# Patient Record
Sex: Female | Born: 1935 | Race: White | Hispanic: No | Marital: Married | Smoking: Former smoker
Health system: Southern US, Community
[De-identification: ages and names within clinical notes are randomized; demographics above are authoritative.]

## PROBLEM LIST (undated history)

## (undated) DIAGNOSIS — I714 Abdominal aortic aneurysm, without rupture, unspecified: Secondary | ICD-10-CM

## (undated) DIAGNOSIS — K5790 Diverticulosis of intestine, part unspecified, without perforation or abscess without bleeding: Secondary | ICD-10-CM

## (undated) DIAGNOSIS — E785 Hyperlipidemia, unspecified: Secondary | ICD-10-CM

## (undated) DIAGNOSIS — K5792 Diverticulitis of intestine, part unspecified, without perforation or abscess without bleeding: Secondary | ICD-10-CM

## (undated) DIAGNOSIS — I471 Supraventricular tachycardia: Secondary | ICD-10-CM

## (undated) DIAGNOSIS — Z862 Personal history of diseases of the blood and blood-forming organs and certain disorders involving the immune mechanism: Secondary | ICD-10-CM

## (undated) DIAGNOSIS — E041 Nontoxic single thyroid nodule: Secondary | ICD-10-CM

## (undated) DIAGNOSIS — M069 Rheumatoid arthritis, unspecified: Secondary | ICD-10-CM

## (undated) DIAGNOSIS — Z87898 Personal history of other specified conditions: Secondary | ICD-10-CM

## (undated) DIAGNOSIS — K219 Gastro-esophageal reflux disease without esophagitis: Secondary | ICD-10-CM

## (undated) DIAGNOSIS — I639 Cerebral infarction, unspecified: Secondary | ICD-10-CM

## (undated) HISTORY — PX: KNEE SURGERY: SHX244

## (undated) HISTORY — PX: CATARACT EXTRACTION: SUR2

## (undated) HISTORY — DX: Personal history of diseases of the blood and blood-forming organs and certain disorders involving the immune mechanism: Z86.2

## (undated) HISTORY — PX: ABDOMINAL HYSTERECTOMY: SUR658

## (undated) HISTORY — DX: Personal history of other specified conditions: Z87.898

## (undated) HISTORY — DX: Diverticulosis of intestine, part unspecified, without perforation or abscess without bleeding: K57.90

## (undated) HISTORY — DX: Rheumatoid arthritis, unspecified: M06.9

## (undated) HISTORY — DX: Abdominal aortic aneurysm, without rupture: I71.4

## (undated) HISTORY — DX: Cerebral infarction, unspecified: I63.9

## (undated) HISTORY — DX: Gastro-esophageal reflux disease without esophagitis: K21.9

## (undated) HISTORY — DX: Hyperlipidemia, unspecified: E78.5

## (undated) HISTORY — DX: Diverticulitis of intestine, part unspecified, without perforation or abscess without bleeding: K57.92

## (undated) HISTORY — DX: Abdominal aortic aneurysm, without rupture, unspecified: I71.40

## (undated) HISTORY — PX: OTHER SURGICAL HISTORY: SHX169

## (undated) HISTORY — DX: Nontoxic single thyroid nodule: E04.1

## (undated) HISTORY — DX: Supraventricular tachycardia: I47.1

## (undated) HISTORY — PX: FOOT SURGERY: SHX648

---

## 2006-05-26 ENCOUNTER — Encounter: Payer: Self-pay | Admitting: Emergency Medicine

## 2006-05-26 ENCOUNTER — Inpatient Hospital Stay (HOSPITAL_COMMUNITY): Admission: EM | Admit: 2006-05-26 | Discharge: 2006-06-02 | Payer: Self-pay | Admitting: Internal Medicine

## 2006-06-26 ENCOUNTER — Encounter: Admission: RE | Admit: 2006-06-26 | Discharge: 2006-06-26 | Payer: Self-pay | Admitting: Rheumatology

## 2006-10-16 ENCOUNTER — Inpatient Hospital Stay (HOSPITAL_COMMUNITY): Admission: EM | Admit: 2006-10-16 | Discharge: 2006-10-19 | Payer: Self-pay | Admitting: Emergency Medicine

## 2007-06-10 ENCOUNTER — Encounter: Admission: RE | Admit: 2007-06-10 | Discharge: 2007-08-05 | Payer: Self-pay | Admitting: Family Medicine

## 2008-01-25 ENCOUNTER — Emergency Department (HOSPITAL_COMMUNITY): Admission: EM | Admit: 2008-01-25 | Discharge: 2008-01-26 | Payer: Self-pay | Admitting: Emergency Medicine

## 2008-04-06 ENCOUNTER — Emergency Department (HOSPITAL_COMMUNITY): Admission: EM | Admit: 2008-04-06 | Discharge: 2008-04-06 | Payer: Self-pay | Admitting: Emergency Medicine

## 2008-05-19 ENCOUNTER — Emergency Department (HOSPITAL_COMMUNITY): Admission: EM | Admit: 2008-05-19 | Discharge: 2008-05-19 | Payer: Self-pay | Admitting: Emergency Medicine

## 2009-01-23 ENCOUNTER — Emergency Department (HOSPITAL_COMMUNITY): Admission: EM | Admit: 2009-01-23 | Discharge: 2009-01-23 | Payer: Self-pay | Admitting: Emergency Medicine

## 2009-04-30 ENCOUNTER — Emergency Department (HOSPITAL_COMMUNITY): Admission: EM | Admit: 2009-04-30 | Discharge: 2009-04-30 | Payer: Self-pay | Admitting: Emergency Medicine

## 2009-06-02 ENCOUNTER — Encounter (HOSPITAL_COMMUNITY): Admission: RE | Admit: 2009-06-02 | Discharge: 2009-08-31 | Payer: Self-pay | Admitting: Rheumatology

## 2009-06-10 ENCOUNTER — Emergency Department (HOSPITAL_COMMUNITY): Admission: EM | Admit: 2009-06-10 | Discharge: 2009-06-11 | Payer: Self-pay | Admitting: Emergency Medicine

## 2009-09-14 ENCOUNTER — Encounter (HOSPITAL_COMMUNITY): Admission: RE | Admit: 2009-09-14 | Discharge: 2009-10-05 | Payer: Self-pay | Admitting: Rheumatology

## 2009-11-09 ENCOUNTER — Encounter (HOSPITAL_COMMUNITY)
Admission: RE | Admit: 2009-11-09 | Discharge: 2010-01-16 | Payer: Self-pay | Source: Home / Self Care | Attending: Rheumatology | Admitting: Rheumatology

## 2009-11-09 ENCOUNTER — Emergency Department (HOSPITAL_COMMUNITY): Admission: EM | Admit: 2009-11-09 | Discharge: 2009-11-09 | Payer: Self-pay | Admitting: Emergency Medicine

## 2009-12-01 ENCOUNTER — Inpatient Hospital Stay (HOSPITAL_COMMUNITY)
Admission: EM | Admit: 2009-12-01 | Discharge: 2009-12-05 | Payer: Self-pay | Source: Home / Self Care | Admitting: Emergency Medicine

## 2009-12-22 ENCOUNTER — Emergency Department (HOSPITAL_COMMUNITY)
Admission: EM | Admit: 2009-12-22 | Discharge: 2009-12-22 | Payer: Self-pay | Source: Home / Self Care | Admitting: Emergency Medicine

## 2010-01-07 ENCOUNTER — Inpatient Hospital Stay (HOSPITAL_COMMUNITY)
Admission: EM | Admit: 2010-01-07 | Discharge: 2010-01-12 | Payer: Self-pay | Source: Home / Self Care | Attending: Internal Medicine | Admitting: Internal Medicine

## 2010-02-19 ENCOUNTER — Other Ambulatory Visit: Payer: Self-pay | Admitting: Gastroenterology

## 2010-02-21 ENCOUNTER — Ambulatory Visit
Admission: RE | Admit: 2010-02-21 | Discharge: 2010-02-21 | Disposition: A | Discharge status: Home / Self Care | Payer: MEDICARE | Source: Ambulatory Visit | Attending: Gastroenterology | Admitting: Gastroenterology

## 2010-02-21 ENCOUNTER — Other Ambulatory Visit: Payer: Self-pay

## 2010-02-21 ENCOUNTER — Other Ambulatory Visit: Payer: Self-pay | Admitting: Gastroenterology

## 2010-02-21 ENCOUNTER — Ambulatory Visit: Payer: Self-pay

## 2010-02-22 ENCOUNTER — Other Ambulatory Visit: Payer: Self-pay

## 2010-03-06 NOTE — H&P (Signed)
[name], PELPHREY NO.:  [id number]  MEDICAL RECORD NO.:  [id number]           PATIENT TYPE:  LOCATION:                                 FACILITY:  PHYSICIAN:  Lucile Crater, MD         DATE OF BIRTH:  31-Oct-1922  DATE OF ADMISSION:  12/02/2009 DATE OF DISCHARGE:                             HISTORY & PHYSICAL   PRIMARY CARE PHYSICIAN:  Zenovia Jordan, MD  CHIEF COMPLAINT:  Generalized weakness and fall.  HISTORY OF PRESENT ILLNESS:  The patient is an 77 year old female with a history of hypertension, CVA in the past, rheumatoid arthritis.  The patient reportedly was having a virus which has affected her GI tract and she was having flu-like symptoms and she saw her primary care physician earlier this week and was told she had a viral infection.  The patient states that she has been feeling progressively weak day by day and she was trying to get out of the den towards her bedroom and she sustained a fall and she had multiple bruises with no major injuries. Reportedly, the patient's appetite has been poor and has not been taking much orally.  She reports to have had fever at home with a T-max of 102 and she has had nasal congestion with occasional epistaxis.  She states that this happens to her when she has her sinusitis act up.  REVIEW OF SYSTEMS:  A complete review of systems was done, which include general; head, eyes, ears, nose, and throat; cardiovascular; respiratory; GI; GU; endocrine; musculoskeletal; skin; neurologic; and psychiatric all were within normal limits other than what was mentioned in the history of present illness.  PAST MEDICAL HISTORY: 1. Hypertension. 2. CVA. 3. Myocardial infarction. 4. Rheumatoid arthritis. 5. Chronic sinusitis.  ALLERGIES:  Sulfa.  CURRENT MEDICATIONS: 1. Amoxicillin 500 mg before dental procedures. 2. Metoprolol 100 mg b.i.d. 3. Crestor 10 mg once a day. 4. Hydroxychloroquine 200 mg once a day. 5.  Meclizine 25 mg 4 times a day p.r.n. 6. Methotrexate 2.5 mg 7 tablets every week. 7. Prednisone 5 mg specialized dosing. 8. Tramadol 1-2 tablets as needed for pain. 9. Potassium chloride 10 mEq once a day. 10.Triamterene/hydrochlorothiazide 37.5/25 mg as needed. 11.Temazepam 15 mg q.h.s. 12.Plavix 75 mg once a day.  SOCIAL HISTORY:  There is no history of tobacco, alcohol, or illicit drugs.  She lives at home with her husband.  FAMILY HISTORY:  Noncontributory.  PHYSICAL EXAMINATION:  VITAL SIGNS:  T-max 102, pulse rate of 118, respiratory rate of 20, O2 sat 97% on room air, blood pressure 170/90. GENERAL APPEARANCE:  Not in acute distress, alert and oriented x3. Afebrile. HEENT:  Normocephalic, atraumatic.  The pupils are equal and reactive to light and accommodation.  Extraocular muscles are intact.  Mucous membranes are moist. NECK:  Supple.  No JVD, lymphadenopathy, or carotid bruit. CVS:  Regular rhythm, rate is normal.  No murmurs, rubs, or gallops. LUNGS:  Clear to auscultation bilaterally. ABDOMEN:  Benign. EXTREMITIES:  No clubbing, cyanosis, or edema. NEUROLOGIC:  Grossly nonfocal.  LABORATORY DATA AND STUDIES: 1. Sodium 139,  potassium 3.1, chloride 102, bicarb of 26, BUN 9,     creatinine 0.84, blood glucose 87, calcium 8.3. 2. Urinalysis within normal limits. 3. WBC 6700, hemoglobin 11.3, hematocrit 100.8, platelets 127,000.     Neutrophils 73%. 4. Chest x-ray, clear.  ASSESSMENT AND PLAN: 1. Generalized weakness with multiple falls.  The patient does not     describe this as orthostasis.  She clearly is dehydrated with less     oral intake, hypovolemia is a possibility.  We will get a CT scan     of the head to evaluate if she had any acute injury from the fall,     and we will not continue any of her diuretics at this time.  We     will closely monitor her. 2. Hypertension, very well controlled.  We will continue her home     medications. 3. Atrial  fibrillation, currently tachycardic, but in sinus rhythm. 4. Acute-on-chronic sinusitis.  We will start her on Augmentin, and we     will also supplement with cetirizine. 5. Coronary artery disease.  We will continue to modify her risk     factors. 6. Rheumatoid arthritis.  We will continue her home medications.  CODE STATUS:  The patient wants to be a full code.  We will honor her wishes.  The patient will be admitted to the hospital for further evaluation and management.     Lucile Crater, MD     TA/MEDQ  D:  12/02/2009  T:  12/02/2009  Job:  [id number]  Electronically Signed by Lucile Crater MD on 03/06/2010 04:15:42 PM

## 2010-03-13 ENCOUNTER — Emergency Department (HOSPITAL_COMMUNITY): Admission: EM | Admit: 2010-03-13 | Payer: MEDICARE | Source: Home / Self Care

## 2010-03-13 ENCOUNTER — Emergency Department (HOSPITAL_COMMUNITY): Payer: Medicare Other

## 2010-03-13 ENCOUNTER — Inpatient Hospital Stay (HOSPITAL_COMMUNITY)
Admission: RE | Admit: 2010-03-13 | Discharge: 2010-03-22 | DRG: 392 | Disposition: A | Discharge status: Home Health | Payer: Medicare Other | Source: Ambulatory Visit | Attending: Internal Medicine | Admitting: Internal Medicine

## 2010-03-13 ENCOUNTER — Ambulatory Visit (HOSPITAL_COMMUNITY): Payer: Medicare Other

## 2010-03-13 DIAGNOSIS — I2510 Atherosclerotic heart disease of native coronary artery without angina pectoris: Secondary | ICD-10-CM | POA: Diagnosis present

## 2010-03-13 DIAGNOSIS — I4891 Unspecified atrial fibrillation: Secondary | ICD-10-CM | POA: Diagnosis present

## 2010-03-13 DIAGNOSIS — R1310 Dysphagia, unspecified: Secondary | ICD-10-CM | POA: Diagnosis present

## 2010-03-13 DIAGNOSIS — K228 Other specified diseases of esophagus: Secondary | ICD-10-CM | POA: Diagnosis present

## 2010-03-13 DIAGNOSIS — K224 Dyskinesia of esophagus: Secondary | ICD-10-CM | POA: Diagnosis present

## 2010-03-13 DIAGNOSIS — K2289 Other specified disease of esophagus: Secondary | ICD-10-CM | POA: Diagnosis present

## 2010-03-13 DIAGNOSIS — D638 Anemia in other chronic diseases classified elsewhere: Secondary | ICD-10-CM | POA: Diagnosis present

## 2010-03-13 DIAGNOSIS — I252 Old myocardial infarction: Secondary | ICD-10-CM

## 2010-03-13 DIAGNOSIS — M545 Low back pain, unspecified: Secondary | ICD-10-CM | POA: Diagnosis present

## 2010-03-13 DIAGNOSIS — E46 Unspecified protein-calorie malnutrition: Secondary | ICD-10-CM | POA: Diagnosis present

## 2010-03-13 DIAGNOSIS — M069 Rheumatoid arthritis, unspecified: Secondary | ICD-10-CM | POA: Diagnosis present

## 2010-03-13 DIAGNOSIS — D509 Iron deficiency anemia, unspecified: Secondary | ICD-10-CM | POA: Diagnosis present

## 2010-03-13 DIAGNOSIS — K222 Esophageal obstruction: Secondary | ICD-10-CM | POA: Diagnosis present

## 2010-03-13 DIAGNOSIS — E785 Hyperlipidemia, unspecified: Secondary | ICD-10-CM | POA: Diagnosis present

## 2010-03-13 DIAGNOSIS — E041 Nontoxic single thyroid nodule: Secondary | ICD-10-CM | POA: Diagnosis present

## 2010-03-13 DIAGNOSIS — R42 Dizziness and giddiness: Secondary | ICD-10-CM | POA: Diagnosis present

## 2010-03-13 DIAGNOSIS — K5732 Diverticulitis of large intestine without perforation or abscess without bleeding: Principal | ICD-10-CM | POA: Diagnosis present

## 2010-03-13 DIAGNOSIS — E876 Hypokalemia: Secondary | ICD-10-CM | POA: Diagnosis present

## 2010-03-13 DIAGNOSIS — N179 Acute kidney failure, unspecified: Secondary | ICD-10-CM | POA: Diagnosis present

## 2010-03-13 LAB — HEPATIC FUNCTION PANEL
AST: 32 U/L (ref 0–37)
Alkaline Phosphatase: 43 U/L (ref 39–117)
Bilirubin, Direct: 0.2 mg/dL (ref 0.0–0.3)
Total Bilirubin: 0.9 mg/dL (ref 0.3–1.2)

## 2010-03-13 LAB — BASIC METABOLIC PANEL
CO2: 31 mEq/L (ref 19–32)
Calcium: 8.6 mg/dL (ref 8.4–10.5)
Chloride: 98 mEq/L (ref 96–112)
Creatinine, Ser: 1.87 mg/dL — ABNORMAL HIGH (ref 0.4–1.2)
Glucose, Bld: 78 mg/dL (ref 70–99)

## 2010-03-13 LAB — DIFFERENTIAL
Lymphocytes Relative: 13 % (ref 12–46)
Lymphs Abs: 1.2 10*3/uL (ref 0.7–4.0)
Monocytes Absolute: 0.1 10*3/uL (ref 0.1–1.0)
Monocytes Relative: 1 % — ABNORMAL LOW (ref 3–12)
Neutro Abs: 7.9 10*3/uL — ABNORMAL HIGH (ref 1.7–7.7)

## 2010-03-13 LAB — CBC
HCT: 33.8 % — ABNORMAL LOW (ref 36.0–46.0)
Hemoglobin: 10.3 g/dL — ABNORMAL LOW (ref 12.0–15.0)
MCH: 29.6 pg (ref 26.0–34.0)
MCHC: 30.5 g/dL (ref 30.0–36.0)
MCV: 97.1 fL (ref 78.0–100.0)
RBC: 3.48 MIL/uL — ABNORMAL LOW (ref 3.87–5.11)

## 2010-03-13 LAB — LIPASE, BLOOD: Lipase: 54 U/L (ref 11–59)

## 2010-03-14 ENCOUNTER — Inpatient Hospital Stay (HOSPITAL_COMMUNITY): Payer: Medicare Other

## 2010-03-14 LAB — FOLATE: Folate: >20 ng/mL

## 2010-03-14 LAB — MAGNESIUM: Magnesium: 1.3 mg/dL — ABNORMAL LOW (ref 1.5–2.5)

## 2010-03-14 LAB — VITAMIN B12: Vitamin B-12: 245 pg/mL (ref 211–911)

## 2010-03-14 LAB — COMPREHENSIVE METABOLIC PANEL
Albumin: 2.5 g/dL — ABNORMAL LOW (ref 3.5–5.2)
Alkaline Phosphatase: 33 U/L — ABNORMAL LOW (ref 39–117)
BUN: 17 mg/dL (ref 6–23)
Chloride: 104 mEq/L (ref 96–112)
Creatinine, Ser: 1.46 mg/dL — ABNORMAL HIGH (ref 0.4–1.2)
GFR calc non Af Amer: 34 mL/min — ABNORMAL LOW (ref >60)
Glucose, Bld: 109 mg/dL — ABNORMAL HIGH (ref 70–99)
Potassium: 3.1 mEq/L — ABNORMAL LOW (ref 3.5–5.1)
Total Bilirubin: 0.6 mg/dL (ref 0.3–1.2)

## 2010-03-14 LAB — GLUCOSE, CAPILLARY: Glucose-Capillary: 79 mg/dL (ref 70–99)

## 2010-03-14 LAB — URINALYSIS, MICROSCOPIC ONLY
Bilirubin Urine: NEGATIVE
Hgb urine dipstick: NEGATIVE
Specific Gravity, Urine: 1.009 (ref 1.005–1.030)
Urine Glucose, Fasting: NEGATIVE mg/dL
Urobilinogen, UA: 1 mg/dL (ref 0.0–1.0)
pH: 7.5 (ref 5.0–8.0)

## 2010-03-14 LAB — CBC
HCT: 29.5 % — ABNORMAL LOW (ref 36.0–46.0)
MCH: 30.2 pg (ref 26.0–34.0)
MCV: 98 fL (ref 78.0–100.0)
Platelets: 146 10*3/uL — ABNORMAL LOW (ref 150–400)
RBC: 3.01 MIL/uL — ABNORMAL LOW (ref 3.87–5.11)
WBC: 6.7 10*3/uL (ref 4.0–10.5)

## 2010-03-14 LAB — IRON AND TIBC
Iron: 40 ug/dL — ABNORMAL LOW (ref 42–135)
UIBC: 145 ug/dL

## 2010-03-14 LAB — MRSA PCR SCREENING: MRSA by PCR: POSITIVE — AB

## 2010-03-14 LAB — FERRITIN: Ferritin: 323 ng/mL — ABNORMAL HIGH (ref 10–291)

## 2010-03-14 LAB — TSH: TSH: 2.925 u[IU]/mL (ref 0.350–4.500)

## 2010-03-15 ENCOUNTER — Inpatient Hospital Stay (HOSPITAL_COMMUNITY): Payer: Medicare Other

## 2010-03-15 LAB — DIFFERENTIAL
Basophils Absolute: 0 10*3/uL (ref 0.0–0.1)
Basophils Relative: 0 % (ref 0–1)
Eosinophils Relative: 1 % (ref 0–5)
Monocytes Absolute: 0.1 10*3/uL (ref 0.1–1.0)
Monocytes Relative: 3 % (ref 3–12)
Neutro Abs: 2.9 10*3/uL (ref 1.7–7.7)

## 2010-03-15 LAB — CBC
Hemoglobin: 9.1 g/dL — ABNORMAL LOW (ref 12.0–15.0)
MCH: 29.8 pg (ref 26.0–34.0)
MCHC: 30.3 g/dL (ref 30.0–36.0)
RDW: 16.7 % — ABNORMAL HIGH (ref 11.5–15.5)

## 2010-03-15 LAB — COMPREHENSIVE METABOLIC PANEL
AST: 31 U/L (ref 0–37)
CO2: 27 mEq/L (ref 19–32)
Calcium: 8.1 mg/dL — ABNORMAL LOW (ref 8.4–10.5)
Creatinine, Ser: 1.36 mg/dL — ABNORMAL HIGH (ref 0.4–1.2)
GFR calc Af Amer: 45 mL/min — ABNORMAL LOW (ref >60)
GFR calc non Af Amer: 37 mL/min — ABNORMAL LOW (ref >60)
Glucose, Bld: 94 mg/dL (ref 70–99)
Sodium: 139 mEq/L (ref 135–145)
Total Protein: 4.8 g/dL — ABNORMAL LOW (ref 6.0–8.3)

## 2010-03-15 LAB — GLUCOSE, CAPILLARY: Glucose-Capillary: 139 mg/dL — ABNORMAL HIGH (ref 70–99)

## 2010-03-15 LAB — MAGNESIUM: Magnesium: 4.4 mg/dL — ABNORMAL HIGH (ref 1.5–2.5)

## 2010-03-15 NOTE — Op Note (Signed)
NAMEAILSA, MIRELES NO.:  [id number]  MEDICAL RECORD NO.:  [id number]           PATIENT TYPE:  O  LOCATION:  WLEN                         FACILITY:  [facility]  PHYSICIAN:  Bernette Redbird, M.D.   DATE OF BIRTH:  May 26, 1947  DATE OF PROCEDURE:  03/13/2010 DATE OF DISCHARGE:                              OPERATIVE REPORT   PROCEDURE:  Upper endoscopy with balloon and Savary dilatation of the esophagus.  INDICATIONS:  An 77 year old patient with rheumatoid arthritis, on Plavix, and known severe esophageal dysmotility who nonetheless recently had hang-up of a barium tablet documented in the proximal esophagus, unable to eat, food coming back up, intolerant of Ensure as her chronic diet, and losing 10 pounds a month for a total weight loss so far of about 25 pounds over the past several months.  Dilatation is being performed to try to alleviate the dysphagia symptoms.  FINDINGS:  Discrete distal esophageal ring, questionable narrowing inthe cervical esophagus.  A 12 mm balloon dilatation performed proximally and distally, followed by Savary dilatation to 14 and then 15 mm.  PROCEDURE:  The nature, purpose, risks of the procedure had been carefully discussed with the patient and her family member.  She came as an outpatient to the [facility] and she provided written consent.  It was elected to keep the patient on her Plavix.  Sedation was propofol per anesthesia.  The Pentax video endoscope was passed under direct vision.  The larynx and vocal cords looked normal.  The esophagus was actually easily entered.  There was no resistance to passage of the endoscope through the cervical esophageal region and down the esophagus which had a lot of spastic motility to the stomach.  With appropriate insufflation of the esophagus, it was possible to confirm the presence of a rather widely patent esophageal mucosal ring at the squamocolumnar junction consistent  with a Schatzki ring.  There was no evidence of any neoplasia, infection, or varices in the esophagus.  The stomach contained no significant residual and had normal mucosa including a retroflex view of the cardia, and the pylorus, duodenal bulb, and second duodenum were unremarkable.  Based on the patient's dysphagia symptoms and the radiographic finding of hang-up of a barium tablet in the proximal esophagus, I went ahead and performed balloon dilatation to 12 mm for approximately 1 minute, both in the distal esophagus and in the proximal esophagus.  This led to minimal mucosal abrasion but no obvious fracture of a ring or stricture. Based on the severity of the patient's symptoms, I felt it was important to go ahead and try to confirm definitively that an adequate luminal diameter had been established, so we went on to Savary dilatation.  The spring tip guidewire was passed into the antrum of the stomach, fluoroscopic confirmation of appropriate positioning of the wire was achieved, and sequential passage was made with Savary dilators, sizes 14 and 15 mm, encountering smooth resistance consistent with esophageal spasm.  The widest portion of the dilator in each case was confirmed to reach level of the diaphragm or below, but I did endoscope the  patient in between the tube dilatations to make sure there was no significant mucosal disruption.  Fluoroscopic confirmation of appropriate wire positioning was used on the second dilatation as well as the first. When completed, after withdrawal of the larger dilator, the patient was re-endoscoped under direct vision.  This showed a small amount of fresh hemorrhage but no active bleeding and no serious lacerations, no evidence of perforation, no active bleeding, and no obvious fracture of her ring or overt mucosal disruption.  The patient tolerated the procedure well, and there were no apparent complications.  IMPRESSION: 1. Esophageal  ring, not corresponding in location of the hang-up of     the barium tablet seen on her recent barium swallow. 2. Endoscopic correlation of the esophageal dysmotility based on     spastic appearance of the esophagus as noted radiographically. 3. Esophageal dilatation performed as described above to 12 mm     proximally and distally using balloons through the scope, and then     14 and 15 mm Savary dilators.  PLAN:  Clinical followup of dysphagia symptoms.          ______________________________ Bernette Redbird, M.D.     RB/MEDQ  D:  03/13/2010  T:  03/13/2010  Job:  [id number]  cc:   Fanny Dance. Rankins, M.D. Fax: [phone]  Electronically Signed by Bernette Redbird M.D. on 03/15/2010 07:59:25 AM

## 2010-03-16 LAB — URINE CULTURE
Colony Count: 15000
Special Requests: NEGATIVE

## 2010-03-16 LAB — CBC
HCT: 27.1 % — ABNORMAL LOW (ref 36.0–46.0)
MCH: 30.6 pg (ref 26.0–34.0)
MCHC: 31.7 g/dL (ref 30.0–36.0)
MCV: 96.4 fL (ref 78.0–100.0)
RDW: 16.7 % — ABNORMAL HIGH (ref 11.5–15.5)

## 2010-03-16 LAB — BASIC METABOLIC PANEL
BUN: 9 mg/dL (ref 6–23)
Calcium: 8 mg/dL — ABNORMAL LOW (ref 8.4–10.5)
Creatinine, Ser: 1.31 mg/dL — ABNORMAL HIGH (ref 0.4–1.2)
GFR calc non Af Amer: 39 mL/min — ABNORMAL LOW (ref >60)
Glucose, Bld: 100 mg/dL — ABNORMAL HIGH (ref 70–99)

## 2010-03-16 LAB — GLUCOSE, CAPILLARY
Glucose-Capillary: 126 mg/dL — ABNORMAL HIGH (ref 70–99)
Glucose-Capillary: 97 mg/dL (ref 70–99)

## 2010-03-17 LAB — GLUCOSE, CAPILLARY: Glucose-Capillary: 78 mg/dL (ref 70–99)

## 2010-03-17 LAB — BASIC METABOLIC PANEL
CO2: 28 mEq/L (ref 19–32)
Calcium: 8.1 mg/dL — ABNORMAL LOW (ref 8.4–10.5)
Creatinine, Ser: 1.05 mg/dL (ref 0.4–1.2)
GFR calc Af Amer: >60 mL/min (ref >60)
Glucose, Bld: 98 mg/dL (ref 70–99)

## 2010-03-17 LAB — CBC
Hemoglobin: 8.9 g/dL — ABNORMAL LOW (ref 12.0–15.0)
MCH: 30 pg (ref 26.0–34.0)
MCHC: 31 g/dL (ref 30.0–36.0)

## 2010-03-18 LAB — GLUCOSE, CAPILLARY
Glucose-Capillary: 107 mg/dL — ABNORMAL HIGH (ref 70–99)
Glucose-Capillary: 86 mg/dL (ref 70–99)

## 2010-03-18 LAB — BASIC METABOLIC PANEL
CO2: 24 mEq/L (ref 19–32)
Chloride: 108 mEq/L (ref 96–112)
GFR calc Af Amer: 59 mL/min — ABNORMAL LOW (ref >60)
Sodium: 139 mEq/L (ref 135–145)

## 2010-03-19 LAB — CBC
MCH: 30.5 pg (ref 26.0–34.0)
MCHC: 31.3 g/dL (ref 30.0–36.0)
Platelets: 81 10*3/uL — ABNORMAL LOW (ref 150–400)

## 2010-03-19 LAB — GLUCOSE, CAPILLARY
Glucose-Capillary: 70 mg/dL (ref 70–99)
Glucose-Capillary: 135 mg/dL — ABNORMAL HIGH (ref 70–99)
Glucose-Capillary: 116 mg/dL — ABNORMAL HIGH (ref 70–99)

## 2010-03-19 LAB — BASIC METABOLIC PANEL
BUN: 6 mg/dL (ref 6–23)
Chloride: 107 mEq/L (ref 96–112)
GFR calc Af Amer: >60 mL/min (ref >60)
GFR calc non Af Amer: 57 mL/min — ABNORMAL LOW (ref >60)
Glucose, Bld: 84 mg/dL (ref 70–99)
Potassium: 3.1 mEq/L — ABNORMAL LOW (ref 3.5–5.1)

## 2010-03-20 ENCOUNTER — Inpatient Hospital Stay (HOSPITAL_COMMUNITY): Payer: Medicare Other

## 2010-03-20 LAB — GLUCOSE, CAPILLARY
Glucose-Capillary: 111 mg/dL — ABNORMAL HIGH (ref 70–99)
Glucose-Capillary: 114 mg/dL — ABNORMAL HIGH (ref 70–99)

## 2010-03-20 LAB — BASIC METABOLIC PANEL
CO2: 25 mEq/L (ref 19–32)
Chloride: 111 mEq/L (ref 96–112)
GFR calc Af Amer: >60 mL/min (ref >60)
Potassium: 3.7 mEq/L (ref 3.5–5.1)

## 2010-03-20 LAB — URINALYSIS, DIPSTICK ONLY
Nitrite: NEGATIVE
Specific Gravity, Urine: 1.013 (ref 1.005–1.030)
Urobilinogen, UA: 0.2 mg/dL (ref 0.0–1.0)
pH: 6 (ref 5.0–8.0)

## 2010-03-20 LAB — CBC
HCT: 26.9 % — ABNORMAL LOW (ref 36.0–46.0)
MCH: 30.5 pg (ref 26.0–34.0)
MCV: 97.8 fL (ref 78.0–100.0)
RBC: 2.75 MIL/uL — ABNORMAL LOW (ref 3.87–5.11)
WBC: 4.2 10*3/uL (ref 4.0–10.5)

## 2010-03-21 LAB — GLUCOSE, CAPILLARY
Glucose-Capillary: 128 mg/dL — ABNORMAL HIGH (ref 70–99)
Glucose-Capillary: 88 mg/dL (ref 70–99)
Glucose-Capillary: 97 mg/dL (ref 70–99)

## 2010-03-22 LAB — URINE CULTURE
Colony Count: NO GROWTH
Culture  Setup Time: 201203070128

## 2010-03-22 LAB — GLUCOSE, CAPILLARY: Glucose-Capillary: 93 mg/dL (ref 70–99)

## 2010-03-22 NOTE — Op Note (Signed)
  NAMEJALIE, [name]                 ACCOUNT NO.:  [id number]  MEDICAL RECORD NO.:  [id number]           PATIENT TYPE:  I  LOCATION:  1513                         FACILITY:  [facility]  PHYSICIAN:  Zerick Prevette C. Madilyn Fireman, M.D.    DATE OF BIRTH:  [birth year]  DATE OF PROCEDURE:  03/16/2010 DATE OF DISCHARGE:                              OPERATIVE REPORT   INDICATIONS FOR PROCEDURE:  Persistent dysphagia with failure to respond to dilatation and several barium swallows showing significant dysmotility.  PROCEDURE:  The patient was placed in the left lateral decubitus position and placed on the pulse monitor with continuous low-flow oxygen delivered by nasal cannula.  She was sedated with 30 mcg IV fentanyl and 3 mg IV Versed.  Olympus video endoscope was advanced under direct vision into the oropharynx and esophagus.  The esophagus was straight with numerous simultaneous contractions occurring throughout the period of observation.  The lower esophageal sphincter was not seen to relax during the time it was observed.  The scope was advanced into the stomach where a small amount of liquid secretions were suctioned from the fundus.  Retroflexed view of the cardia was unremarkable.  The fundus, body, antrum and pylorus all appeared normal.  The duodenum was entered and both the bulb and second portion were well inspected and appeared to be within normal limits.  The scope was advanced back into the distal esophagus.  Botulinum toxin was injected in 4 aliquots in 4 different quadrants at the GE junction 25 units each.  After this, the lower esophageal sphincter relaxed and rather clearly demonstrated a distal typical fibrotic-appearing lower esophageal ring.  Also about a centimeter above it, there was at least 50% circumferential less narrow ring.  These were photographed.  The scope was then withdrawn, and the patient returned to the recovery room in stable condition.  She tolerated the procedure well.   There were no immediate complications.  IMPRESSION:  Evidence of esophageal dysmotility but with 1 and possibly 2 lower esophageal rings visualized only after Botox injection.  PLAN:  We will observe response to Botox injection, but if no good response, we will probably repeat EGD with Savary dilation to at least 16 mm.          ______________________________ Everardo All. Madilyn Fireman, M.D.     JCH/MEDQ  D:  03/16/2010  T:  03/16/2010  Job:  [id number]  Electronically Signed by Dorena Cookey M.D. on 03/20/2010 07:14:00 PM

## 2010-03-23 NOTE — Discharge Summary (Signed)
[name], [name] NO.:  [id number]  MEDICAL RECORD NO.:  [id number]           PATIENT TYPE:  I  LOCATION:  1513                         FACILITY:  [facility]  PHYSICIAN:  Talmage Nap, MD  DATE OF BIRTH:  01/21/1922  DATE OF ADMISSION:  03/13/2010 DATE OF DISCHARGE:  03/22/2010                        DISCHARGE SUMMARY - REFERRING   PRIMARY CARE PHYSICIAN:  [name] Fiedler, MD  GASTROENTEROLOGIST:  Bernette Redbird, MD  DISCHARGE DIAGNOSES: 1. Acute sigmoid diverticulitis. 2. Dysphagia/esophageal stricture/esophageal dysmotility, status post     esophagogastroduodenoscopy with Botox. 3. Electrolyte imbalance, i.e., hypokalemia, repleted. 4. Cystic thyroid mass. 5. Acute renal failure, resolved. 6. Dimorphic anemia (chronic disorder and iron deficiency). 7. Protein energy malnutrition. 8. Rheumatoid arthritis. 9. Hypertension. 10.Atrial fibrillation.  The patient is not a candidate for active     coagulation. 11.Coronary artery disease. 12.Chronic low back pain. 13.Hyperlipidemia. 14.Chronic vertigo.  HOSPITAL COURSE:  Please for hospital course on this patient from admission to March 18, 2010, refer to the progress note dictated by Dr. Ramiro Harvest.  The patient was also seen from March 5 to 6 by Dr. Ebony Cargo.  The patient was however seen by me on two occasions.  The very first occasion was on March 21, 2010, and in this encounter, the patient complained about episode of loose stools and examination of the patient was essentially unremarkable.  Her vital signs, blood pressure was 106/57, pulse 87, respirations 14, temperature 98.2.  In this encounter, no major changes were made in the patient's management, however, the patient was given Lomotil 2 tablets p.o. stat.  She was however reevaluated by me on March 22, 2010.  The frequency of diarrhea had reduced remarkably and again examination of the patient was essentially unremarkable.  Her  vital signs, blood pressure 112/63, pulse 77, respiratory rate 20, temperature 98.0.  The patient is however medically stable.  Plan is for the patient to be discharged today.  DISCHARGE INSTRUCTIONS: 1. Activity:  As tolerated. 2. Diet:  Low-sodium, low-cholesterol diet, as well as cardiac prudent     diet. 3. She will be followed up by her primary care doctor in 1 to 2 weeks. 4. She will also require home health, PT, OT and RN.  MEDICATIONS ON DISCHARGE: 1. Ciprofloxacin 500 mg p.o. b.i.d. for the next 5 days. 2. Lomotil 2 tablets p.o. with each loose stool, not to exceed 8     tablets in 24 hours. 3. Metronidazole (Flagyl) 250 mg 1 p.o. b.i.d. for 5 days. 4. Align (Bifidobacterium infantis) 4 mg 1 capsule p.o. daily. 5. Calcium carbonate 600 mg/vitamin D 400 units 2 tablets p.o. daily. 6. Crestor (rosuvastatin) 10 mg 1 p.o. daily at bedtime. 7. Folic acid 2.4 mg 3 tablets p.o. daily. 8. Hydroxychloroquine (Plaquenil) 20 mg 2 tablets p.o. daily. 9. Imodium (loperamide) 2 mg 1 p.o. q.4 p.r.n. 10.Maalox (magnesium hydroxide/aluminium hydroxide) 1 tablespoonful by     mouth twice a day p.r.n. 11.Meclizine 25 mg 1 p.o. 4 times p.r.n. 12.Methotrexate 2.5 mg, that is 6 tablets p.o. every week. 13.Metoprolol tartrate 100 mg 1 p.o. b.i.d. 14.MiraLAX (polyethylene glycol) 3350  8.5 g by mouth daily. 15.Multivitamin 1 tablet p.o. daily. 16.Ondansetron 4 mg 1 p.o. q.4 h p.r.n. 17.Phenylephrine 10 mg 1 p.o. daily p.r.n. 18.Plavix (clopidogrel) 75 mg p.o. daily. 19.Potassium chloride 10 mEq p.o. daily. 20.Prednisone 5 mg 1-1/2 tablet p.o. daily. 21.Protonix (pantoprazole) 40 mg 1 p.o. daily. 22.Simethicone (Gas-X) over-the-counter 1 p.o. t.i.d. p.r.n. 23.Tramadol 50 mg 1 to 2 tablets p.o. t.i.d. p.r.n. 24.Triamterene/hydrochlorothiazide 7.5/25 1 p.o. daily. 25.Tylenol Extra Strength (acetaminophen) 500 mg 2 tablets p.o. q.6     p.r.n.     Talmage Nap, MD     CN/MEDQ  D:   03/22/2010  T:  03/22/2010  Job:  [id number]  cc:   Fanny Dance. Rankins, M.D. Fax: [phone]  Electronically Signed by Talmage Nap  on 03/22/2010 06:07:23 PM

## 2010-03-26 ENCOUNTER — Inpatient Hospital Stay (HOSPITAL_COMMUNITY)
Admission: AD | Admit: 2010-03-26 | Discharge: 2010-04-06 | DRG: 194 | Disposition: A | Discharge status: Skilled Nursing Facility | Payer: Medicare Other | Source: Ambulatory Visit | Attending: Internal Medicine | Admitting: Internal Medicine

## 2010-03-26 ENCOUNTER — Inpatient Hospital Stay (HOSPITAL_COMMUNITY): Payer: Medicare Other

## 2010-03-26 DIAGNOSIS — R1310 Dysphagia, unspecified: Secondary | ICD-10-CM | POA: Diagnosis present

## 2010-03-26 DIAGNOSIS — E785 Hyperlipidemia, unspecified: Secondary | ICD-10-CM | POA: Diagnosis present

## 2010-03-26 DIAGNOSIS — E041 Nontoxic single thyroid nodule: Secondary | ICD-10-CM | POA: Diagnosis present

## 2010-03-26 DIAGNOSIS — M069 Rheumatoid arthritis, unspecified: Secondary | ICD-10-CM | POA: Diagnosis present

## 2010-03-26 DIAGNOSIS — D702 Other drug-induced agranulocytosis: Secondary | ICD-10-CM | POA: Diagnosis present

## 2010-03-26 DIAGNOSIS — I2510 Atherosclerotic heart disease of native coronary artery without angina pectoris: Secondary | ICD-10-CM | POA: Diagnosis present

## 2010-03-26 DIAGNOSIS — E876 Hypokalemia: Secondary | ICD-10-CM | POA: Diagnosis not present

## 2010-03-26 DIAGNOSIS — J189 Pneumonia, unspecified organism: Principal | ICD-10-CM | POA: Diagnosis present

## 2010-03-26 DIAGNOSIS — R627 Adult failure to thrive: Secondary | ICD-10-CM | POA: Diagnosis present

## 2010-03-26 DIAGNOSIS — I10 Essential (primary) hypertension: Secondary | ICD-10-CM | POA: Diagnosis present

## 2010-03-26 DIAGNOSIS — IMO0002 Reserved for concepts with insufficient information to code with codable children: Secondary | ICD-10-CM

## 2010-03-26 DIAGNOSIS — K5732 Diverticulitis of large intestine without perforation or abscess without bleeding: Secondary | ICD-10-CM | POA: Diagnosis present

## 2010-03-26 DIAGNOSIS — T3795XA Adverse effect of unspecified systemic anti-infective and antiparasitic, initial encounter: Secondary | ICD-10-CM | POA: Diagnosis present

## 2010-03-26 DIAGNOSIS — D638 Anemia in other chronic diseases classified elsewhere: Secondary | ICD-10-CM | POA: Diagnosis present

## 2010-03-26 DIAGNOSIS — I4891 Unspecified atrial fibrillation: Secondary | ICD-10-CM | POA: Diagnosis present

## 2010-03-26 DIAGNOSIS — K224 Dyskinesia of esophagus: Secondary | ICD-10-CM | POA: Diagnosis present

## 2010-03-26 DIAGNOSIS — T373X5A Adverse effect of other antiprotozoal drugs, initial encounter: Secondary | ICD-10-CM | POA: Diagnosis present

## 2010-03-26 DIAGNOSIS — E46 Unspecified protein-calorie malnutrition: Secondary | ICD-10-CM | POA: Diagnosis present

## 2010-03-26 DIAGNOSIS — G894 Chronic pain syndrome: Secondary | ICD-10-CM | POA: Diagnosis present

## 2010-03-26 LAB — COMPREHENSIVE METABOLIC PANEL
ALT: 8 U/L (ref 0–35)
ALT: 19 U/L (ref 0–35)
AST: 36 U/L (ref 0–37)
Albumin: 2.7 g/dL — ABNORMAL LOW (ref 3.5–5.2)
Alkaline Phosphatase: 53 U/L (ref 39–117)
BUN: 15 mg/dL (ref 6–23)
BUN: 4 mg/dL — ABNORMAL LOW (ref 6–23)
CO2: 27 mEq/L (ref 19–32)
CO2: 27 mEq/L (ref 19–32)
CO2: 22 mEq/L (ref 19–32)
Calcium: 7.4 mg/dL — ABNORMAL LOW (ref 8.4–10.5)
Calcium: 8.2 mg/dL — ABNORMAL LOW (ref 8.4–10.5)
Chloride: 98 mEq/L (ref 96–112)
Chloride: 102 mEq/L (ref 96–112)
Creatinine, Ser: 0.9 mg/dL (ref 0.4–1.2)
Creatinine, Ser: 1.26 mg/dL — ABNORMAL HIGH (ref 0.4–1.2)
Creatinine, Ser: 1.06 mg/dL (ref 0.4–1.2)
GFR calc non Af Amer: 40 mL/min — ABNORMAL LOW (ref >60)
GFR calc non Af Amer: 60 mL/min — ABNORMAL LOW (ref >60)
GFR calc non Af Amer: 49 mL/min — ABNORMAL LOW (ref >60)
Glucose, Bld: 86 mg/dL (ref 70–99)
Glucose, Bld: 57 mg/dL — ABNORMAL LOW (ref 70–99)
Potassium: 3.2 mEq/L — ABNORMAL LOW (ref 3.5–5.1)
Sodium: 137 mEq/L (ref 135–145)
Total Bilirubin: 0.7 mg/dL (ref 0.3–1.2)
Total Bilirubin: 0.9 mg/dL (ref 0.3–1.2)

## 2010-03-26 LAB — CBC
HCT: 31.9 % — ABNORMAL LOW (ref 36.0–46.0)
HCT: 35.3 % — ABNORMAL LOW (ref 36.0–46.0)
HCT: 38.9 % (ref 36.0–46.0)
Hemoglobin: 10.7 g/dL — ABNORMAL LOW (ref 12.0–15.0)
Hemoglobin: 10.9 g/dL — ABNORMAL LOW (ref 12.0–15.0)
Hemoglobin: 12.3 g/dL (ref 12.0–15.0)
MCH: 31.2 pg (ref 26.0–34.0)
MCH: 31.4 pg (ref 26.0–34.0)
MCH: 31.6 pg (ref 26.0–34.0)
MCH: 31.8 pg (ref 26.0–34.0)
MCH: 31.9 pg (ref 26.0–34.0)
MCH: 29.9 pg (ref 26.0–34.0)
MCHC: 30.7 g/dL (ref 30.0–36.0)
MCHC: 30.8 g/dL (ref 30.0–36.0)
MCHC: 30.9 g/dL (ref 30.0–36.0)
MCHC: 30.6 g/dL (ref 30.0–36.0)
MCV: 101 fL — ABNORMAL HIGH (ref 78.0–100.0)
MCV: 102.2 fL — ABNORMAL HIGH (ref 78.0–100.0)
MCV: 102.3 fL — ABNORMAL HIGH (ref 78.0–100.0)
MCV: 97.6 fL (ref 78.0–100.0)
Platelets: 230 10*3/uL (ref 150–400)
Platelets: 275 10*3/uL (ref 150–400)
Platelets: 323 10*3/uL (ref 150–400)
RBC: 3.37 MIL/uL — ABNORMAL LOW (ref 3.87–5.11)
RBC: 3.85 MIL/uL — ABNORMAL LOW (ref 3.87–5.11)
RDW: 15.9 % — ABNORMAL HIGH (ref 11.5–15.5)
RDW: 16.1 % — ABNORMAL HIGH (ref 11.5–15.5)
WBC: 11.1 10*3/uL — ABNORMAL HIGH (ref 4.0–10.5)

## 2010-03-26 LAB — BASIC METABOLIC PANEL
BUN: 1 mg/dL — ABNORMAL LOW (ref 6–23)
BUN: 2 mg/dL — ABNORMAL LOW (ref 6–23)
CO2: 23 mEq/L (ref 19–32)
CO2: 24 mEq/L (ref 19–32)
CO2: 27 mEq/L (ref 19–32)
CO2: 28 mEq/L (ref 19–32)
Calcium: 7.4 mg/dL — ABNORMAL LOW (ref 8.4–10.5)
Calcium: 7.5 mg/dL — ABNORMAL LOW (ref 8.4–10.5)
Calcium: 8 mg/dL — ABNORMAL LOW (ref 8.4–10.5)
Chloride: 106 mEq/L (ref 96–112)
Creatinine, Ser: 1.1 mg/dL (ref 0.4–1.2)
GFR calc Af Amer: 57 mL/min — ABNORMAL LOW (ref >60)
GFR calc Af Amer: >60 mL/min (ref >60)
GFR calc non Af Amer: 47 mL/min — ABNORMAL LOW (ref >60)
GFR calc non Af Amer: >60 mL/min (ref >60)
GFR calc non Af Amer: >60 mL/min (ref >60)
Glucose, Bld: 102 mg/dL — ABNORMAL HIGH (ref 70–99)
Glucose, Bld: 110 mg/dL — ABNORMAL HIGH (ref 70–99)
Glucose, Bld: 61 mg/dL — ABNORMAL LOW (ref 70–99)
Potassium: 2.8 mEq/L — ABNORMAL LOW (ref 3.5–5.1)
Sodium: 142 mEq/L (ref 135–145)

## 2010-03-26 LAB — URINALYSIS, ROUTINE W REFLEX MICROSCOPIC
Bilirubin Urine: NEGATIVE
Glucose, UA: NEGATIVE mg/dL
Specific Gravity, Urine: 1.011 (ref 1.005–1.030)
Urobilinogen, UA: 0.2 mg/dL (ref 0.0–1.0)

## 2010-03-26 LAB — DIFFERENTIAL
Basophils Absolute: 0.1 10*3/uL (ref 0.0–0.1)
Basophils Relative: 1 % (ref 0–1)
Eosinophils Relative: 1 % (ref 0–5)
Monocytes Absolute: 0.8 10*3/uL (ref 0.1–1.0)
Neutro Abs: 7.6 10*3/uL (ref 1.7–7.7)

## 2010-03-26 LAB — URINE CULTURE: Colony Count: 75000

## 2010-03-26 LAB — URINE MICROSCOPIC-ADD ON

## 2010-03-26 LAB — GLUCOSE, CAPILLARY
Glucose-Capillary: 57 mg/dL — ABNORMAL LOW (ref 70–99)
Glucose-Capillary: 75 mg/dL (ref 70–99)

## 2010-03-26 LAB — PROTIME-INR: Prothrombin Time: 15.2 seconds (ref 11.6–15.2)

## 2010-03-26 LAB — FOLATE RBC: RBC Folate: 932 ng/mL — ABNORMAL HIGH (ref 180–600)

## 2010-03-26 LAB — OVA AND PARASITE EXAMINATION

## 2010-03-26 LAB — STOOL CULTURE

## 2010-03-26 LAB — LIPASE, BLOOD: Lipase: 29 U/L (ref 11–59)

## 2010-03-27 ENCOUNTER — Inpatient Hospital Stay (HOSPITAL_COMMUNITY): Payer: Medicare Other

## 2010-03-27 LAB — URINE CULTURE
Colony Count: 75000
Culture  Setup Time: 201112100323

## 2010-03-27 LAB — CULTURE, BLOOD (ROUTINE X 2)
Culture  Setup Time: 201111201743
Culture: NO GROWTH

## 2010-03-27 LAB — BASIC METABOLIC PANEL
BUN: 15 mg/dL (ref 6–23)
BUN: 12 mg/dL (ref 6–23)
CO2: 26 mEq/L (ref 19–32)
Calcium: 8.2 mg/dL — ABNORMAL LOW (ref 8.4–10.5)
Calcium: 8.3 mg/dL — ABNORMAL LOW (ref 8.4–10.5)
Calcium: 8.3 mg/dL — ABNORMAL LOW (ref 8.4–10.5)
Chloride: 107 mEq/L (ref 96–112)
Creatinine, Ser: 1 mg/dL (ref 0.4–1.2)
Creatinine, Ser: 0.84 mg/dL (ref 0.4–1.2)
Creatinine, Ser: 0.86 mg/dL (ref 0.4–1.2)
GFR calc Af Amer: >60 mL/min (ref >60)
GFR calc Af Amer: >60 mL/min (ref >60)
GFR calc non Af Amer: 53 mL/min — ABNORMAL LOW (ref >60)
GFR calc non Af Amer: 53 mL/min — ABNORMAL LOW (ref >60)
Glucose, Bld: 76 mg/dL (ref 70–99)
Sodium: 138 mEq/L (ref 135–145)

## 2010-03-27 LAB — CBC
HCT: 26.5 % — ABNORMAL LOW (ref 36.0–46.0)
HCT: 37.4 % (ref 36.0–46.0)
Hemoglobin: 11.9 g/dL — ABNORMAL LOW (ref 12.0–15.0)
MCH: 33.1 pg (ref 26.0–34.0)
MCH: 33.6 pg (ref 26.0–34.0)
MCH: 34 pg (ref 26.0–34.0)
MCHC: 31.3 g/dL (ref 30.0–36.0)
MCHC: 31.8 g/dL (ref 30.0–36.0)
MCHC: 33.7 g/dL (ref 30.0–36.0)
MCV: 100.8 fL — ABNORMAL HIGH (ref 78.0–100.0)
MCV: 101 fL — ABNORMAL HIGH (ref 78.0–100.0)
MCV: 101.2 fL — ABNORMAL HIGH (ref 78.0–100.0)
MCV: 104.2 fL — ABNORMAL HIGH (ref 78.0–100.0)
MCV: 97.1 fL (ref 78.0–100.0)
Platelets: 127 10*3/uL — ABNORMAL LOW (ref 150–400)
Platelets: 134 10*3/uL — ABNORMAL LOW (ref 150–400)
Platelets: 198 10*3/uL (ref 150–400)
Platelets: 224 10*3/uL (ref 150–400)
RBC: 3.28 MIL/uL — ABNORMAL LOW (ref 3.87–5.11)
RBC: 3.32 MIL/uL — ABNORMAL LOW (ref 3.87–5.11)
RBC: 3.59 MIL/uL — ABNORMAL LOW (ref 3.87–5.11)
RDW: 16.9 % — ABNORMAL HIGH (ref 11.5–15.5)
RDW: 17.6 % — ABNORMAL HIGH (ref 11.5–15.5)
RDW: 18.5 % — ABNORMAL HIGH (ref 11.5–15.5)
RDW: 18.7 % — ABNORMAL HIGH (ref 11.5–15.5)
RDW: 19 % — ABNORMAL HIGH (ref 11.5–15.5)
WBC: 10 K/uL (ref 4.0–10.5)
WBC: 4.6 10*3/uL (ref 4.0–10.5)
WBC: 5.1 10*3/uL (ref 4.0–10.5)

## 2010-03-27 LAB — CK TOTAL AND CKMB (NOT AT ARMC)
CK, MB: 0.6 ng/mL (ref 0.3–4.0)
Relative Index: INVALID (ref 0.0–2.5)
Relative Index: INVALID (ref 0.0–2.5)
Total CK: 38 U/L (ref 7–177)

## 2010-03-27 LAB — DIFFERENTIAL
Basophils Absolute: 0 10*3/uL (ref 0.0–0.1)
Basophils Absolute: 0 10*3/uL (ref 0.0–0.1)
Basophils Absolute: 0 10*3/uL (ref 0.0–0.1)
Basophils Absolute: 0.1 10*3/uL (ref 0.0–0.1)
Basophils Relative: 0 % (ref 0–1)
Basophils Relative: 0 % (ref 0–1)
Basophils Relative: 1 % (ref 0–1)
Eosinophils Absolute: 0 10*3/uL (ref 0.0–0.7)
Eosinophils Absolute: 0.1 10*3/uL (ref 0.0–0.7)
Eosinophils Absolute: 0.2 K/uL (ref 0.0–0.7)
Eosinophils Relative: 1 % (ref 0–5)
Eosinophils Relative: 2 % (ref 0–5)
Lymphocytes Relative: 16 % (ref 12–46)
Lymphocytes Relative: 26 % (ref 12–46)
Lymphs Abs: 0.8 10*3/uL (ref 0.7–4.0)
Lymphs Abs: 1.3 10*3/uL (ref 0.7–4.0)
Lymphs Abs: 2.6 K/uL (ref 0.7–4.0)
Monocytes Absolute: 0.1 10*3/uL (ref 0.1–1.0)
Monocytes Absolute: 0.3 10*3/uL (ref 0.1–1.0)
Monocytes Absolute: 1 K/uL (ref 0.1–1.0)
Monocytes Relative: 10 % (ref 3–12)
Neutro Abs: 3.3 10*3/uL (ref 1.7–7.7)
Neutro Abs: 3.9 10*3/uL (ref 1.7–7.7)
Neutro Abs: 6.1 10*3/uL (ref 1.7–7.7)
Neutrophils Relative %: 61 % (ref 43–77)
Neutrophils Relative %: 73 % (ref 43–77)
Neutrophils Relative %: 80 % — ABNORMAL HIGH (ref 43–77)

## 2010-03-27 LAB — COMPREHENSIVE METABOLIC PANEL
Albumin: 2.7 g/dL — ABNORMAL LOW (ref 3.5–5.2)
Albumin: 3 g/dL — ABNORMAL LOW (ref 3.5–5.2)
Alkaline Phosphatase: 55 U/L (ref 39–117)
BUN: 10 mg/dL (ref 6–23)
BUN: 6 mg/dL (ref 6–23)
Calcium: 8.1 mg/dL — ABNORMAL LOW (ref 8.4–10.5)
Chloride: 100 mEq/L (ref 96–112)
Creatinine, Ser: 0.65 mg/dL (ref 0.4–1.2)
Creatinine, Ser: 0.9 mg/dL (ref 0.4–1.2)
GFR calc Af Amer: >60 mL/min (ref >60)
GFR calc non Af Amer: >60 mL/min (ref >60)
Glucose, Bld: 80 mg/dL (ref 70–99)
Potassium: 3.4 mEq/L — ABNORMAL LOW (ref 3.5–5.1)
Total Bilirubin: 0.8 mg/dL (ref 0.3–1.2)
Total Protein: 7 g/dL (ref 6.0–8.3)

## 2010-03-27 LAB — URINALYSIS, ROUTINE W REFLEX MICROSCOPIC
Bilirubin Urine: NEGATIVE
Bilirubin Urine: NEGATIVE
Bilirubin Urine: NEGATIVE
Glucose, UA: NEGATIVE mg/dL
Glucose, UA: NEGATIVE mg/dL
Hgb urine dipstick: NEGATIVE
Hgb urine dipstick: NEGATIVE
Ketones, ur: NEGATIVE mg/dL
Ketones, ur: NEGATIVE mg/dL
Nitrite: NEGATIVE
Nitrite: NEGATIVE
Protein, ur: NEGATIVE mg/dL
Protein, ur: NEGATIVE mg/dL
Specific Gravity, Urine: 1.008 (ref 1.005–1.030)
Specific Gravity, Urine: 1.018 (ref 1.005–1.030)
Urobilinogen, UA: 0.2 mg/dL (ref 0.0–1.0)
Urobilinogen, UA: 1 mg/dL (ref 0.0–1.0)
pH: 6 (ref 5.0–8.0)

## 2010-03-27 LAB — URINE MICROSCOPIC-ADD ON

## 2010-03-27 LAB — MAGNESIUM: Magnesium: 1.5 mg/dL (ref 1.5–2.5)

## 2010-03-27 LAB — TROPONIN I: Troponin I: 0.02 ng/mL (ref 0.00–0.06)

## 2010-03-27 LAB — POCT CARDIAC MARKERS
CKMB, poc: <1 ng/mL — ABNORMAL LOW (ref 1.0–8.0)
Myoglobin, poc: 120 ng/mL (ref 12–200)
Troponin i, poc: <0.05 ng/mL (ref 0.00–0.09)

## 2010-03-27 LAB — COMPREHENSIVE METABOLIC PANEL WITH GFR
ALT: 9 U/L (ref 0–35)
AST: 29 U/L (ref 0–37)
CO2: 27 meq/L (ref 19–32)
Calcium: 9.1 mg/dL (ref 8.4–10.5)
Chloride: 100 meq/L (ref 96–112)
GFR calc Af Amer: >60 mL/min (ref >60)
GFR calc non Af Amer: 60 mL/min — ABNORMAL LOW (ref >60)
Sodium: 139 meq/L (ref 135–145)
Total Bilirubin: 0.6 mg/dL (ref 0.3–1.2)

## 2010-03-27 LAB — PROTIME-INR
INR: 1.1 (ref 0.00–1.49)
Prothrombin Time: 14.4 seconds (ref 11.6–15.2)

## 2010-03-27 LAB — APTT: aPTT: 38 seconds — ABNORMAL HIGH (ref 24–37)

## 2010-03-28 LAB — CBC
MCH: 29.8 pg (ref 26.0–34.0)
MCHC: 31.2 g/dL (ref 30.0–36.0)
Platelets: 260 10*3/uL (ref 150–400)
RBC: 2.65 MIL/uL — ABNORMAL LOW (ref 3.87–5.11)

## 2010-03-28 LAB — BASIC METABOLIC PANEL
BUN: 12 mg/dL (ref 6–23)
Chloride: 104 mEq/L (ref 96–112)
Creatinine, Ser: 0.88 mg/dL (ref 0.4–1.2)
Glucose, Bld: 75 mg/dL (ref 70–99)
Potassium: 3.3 mEq/L — ABNORMAL LOW (ref 3.5–5.1)

## 2010-03-28 LAB — PREPARE RBC (CROSSMATCH)

## 2010-03-28 NOTE — H&P (Signed)
[name], POKORNY NO.:  [id number]  MEDICAL RECORD NO.:  [id number]           PATIENT TYPE:  I  LOCATION:  1303                         FACILITY:  [facility]  PHYSICIAN:  Vania Rea, M.D. DATE OF BIRTH:  Jan 29, 1922  DATE OF ADMISSION:  03/26/2010 DATE OF DISCHARGE:                             HISTORY & PHYSICAL   PRIMARY CARE PHYSICIAN:  Turkey R. Rankins, M.D. with [facility].  GASTROENTEROLOGIST:  Bernette Redbird, M.D. with [facility].  CHIEF COMPLAINT:  Progressive cough and weakness and fell yesterday.  HISTORY OF PRESENT ILLNESS:  This is an 77 year old Caucasian lady, who lives with her son and was in fact discharged from the hospital about 4 days ago after treatment for multiple conditions including her acute diverticulitis; esophageal dysmotility and strictures, for which she had dilation and Botox; renal insufficiency.  Patient has multiple other chronic medical problems and was discharged home for continued management and home health physical therapy.  However, over the weekend, patient developed cough, became increasingly weak and was visited by the home health nurse today, who noted that her lungs felt "wet" and advised that she is going to see her doctor.  She was evaluated by Dr. Juluis Rainier in [facility] today, who did chest x-ray, which suggested a bronchopneumonia and the hospitalist service was called for direct admission.  Patient denies any fever.  She does admit to increasing cough.  Her son says she has eaten very little while at home and has become progressively more weak.  She tends to vomit whenever she eats anything. She, however, has no diarrhea.  No abdominal pains.  No chest pains. She has had no blood nor black stool.  PAST MEDICAL HISTORY: 1. Sigmoid diverticulitis, completing a course of Cipro and Flagyl. 2. Esophageal dysmotility and esophageal stricture, status post recent     EGD  dilation and Botox 3. Hypokalemia, resolved. 4. Cystic thyroid mass. 5. Acute renal failure, recently treated. 6. Anemia of chronic disease. 7. Protein energy malnutrition. 8. Rheumatoid arthritis. 9. Hypertension. 10.Atrial fibrillation, not a Coumadin candidate. 11.Coronary artery disease. 12.Chronic back pain. 13.Hyperlipidemia. 14.Chronic vertigo.  MEDICATIONS:  Include: 1. Metoprolol 100 mg twice daily. 2. Triamterene HCTZ 37.5/25 mg 1 tablet daily p.r.n. for lower     extremity swelling. 3. Plaquenil 200 mg twice daily. 4. Align capsules as directed 5. Tylenol 375 mg three times daily as needed. 6. Crestor 10 mg daily. 7. Multivitamins one daily. 8. Folic acid 2.4 mg three times daily. 9. Plavix 75 mg daily. 10.Potassium chloride 10 mEq daily. 11.Prednisone 7.5 mg daily. 12.Methotrexate 2.5 mg 6 tablets weekly. 13.Protonix 40 mg daily. 14.Zofran 4 mg as needed. 15.Tramadol 50 mg one or two tablets three times a day as needed for     joint pains. 16.Cipro 500 mg every 12 hours, scheduled for stop date of tomorrow. 17.Flagyl 250 mg twice daily, scheduled for a stop date of tomorrow. 18.Lomotil 2 tablets p.r.n. for diarrhea, not to exceed eight per day. 19.Calcium with vitamin D daily. 20.Maalox 1 tablet twice daily when necessary. 21.Meclizine 25 mg four times daily  when necessary. 22.Phenylephrine 10 mg as needed daily.  ALLERGIES:  SULFA.  SOCIAL HISTORY:  Reviewed and unchanged from recent admission and her primary physician's notes.  FAMILY HISTORY:  Reviewed and unchanged from recent admission and her primary physician's notes.  REVIEW OF SYSTEMS:  Other than noted above, unremarkable.  PHYSICAL EXAMINATION:  GENERAL:  Elderly Caucasian lady sitting up in the bed. VITAL SIGNS:  Her temperature is 98.3, pulse 84, respiration 18, blood pressure 125/87.  She is saturating at 91% on room air. HEENT:  Pupils are round and equal.  Mucous membranes pale,  anicteric. No cervical lymphadenopathy.  No carotid bruit. CHEST:  She has coarse bibasilar crackles. CARDIOVASCULAR SYSTEM:  Regular rhythm.  No murmur heard. ABDOMEN:  Soft, nontender.  No masses felt. EXTREMITIES:  She has thin skin and venostasis changes of both legs with 1+ edema bilaterally.  She has some skin tears with consequent stage II ulcerations of the left leg in particular.  She has arthritic deformities of the hands, knees, ankles with ulnar deviation of the joints of the hand. CENTRAL NERVOUS SYSTEM:  Cranial nerves III-XII are grossly intact.  She has no focal lateralizing signs.  LABORATORY DATA:  Her white count is 2.5, hemoglobin 8.6, MCV 97.6, platelets 233.  Her PT is 15.2, INR 1.18, PTT 29.  Complete metabolic panel is pending.  DIAGNOSTIC STUDIES:  Portable chest x-ray shows new right lung base peribronchovascular opacity suspicious for acute bronchopneumonia.  ASSESSMENT: 1. Hospital-acquired pneumonia. 2. Neutropenia possibly secondary to Flagyl or Cipro, possibly     secondary to pneumonia. 3. Esophageal dysmotility. 4. Sigmoid diverticulitis. 5. Cystic thyroid mass. 6. Adult failure to thrive. 7. Other chronic medical problems as noted above.  PLAN: 1. Patient does not appear to be in severe respiratory distress,     though she is weak.  She is oxygenating adequately.  We will keep     her on a med-surg bed and start broad-spectrum antibiotic coverage     for hospital-acquired pneumonia.  We will use Cipro and vancomycin.     We will continue C. Diff prophylaxis, but we will not continue     Cipro and Flagyl for time being.  We will monitor her white count     while in hospital and we will await the results of her serum     chemistry. 2. Other plans as per orders.     Vania Rea, M.D.     LC/MEDQ  D:  03/26/2010  T:  03/26/2010  Job:  [id number]  cc:   Juluis Rainier, M.D.  Turkey R. Rankins, M.D. Fax: [phone]  Bernette Redbird, M.D. Fax: [phone]  Electronically Signed by Vania Rea M.D. on 03/28/2010 03:01:56 AM

## 2010-03-29 LAB — BASIC METABOLIC PANEL
CO2: 24 mEq/L (ref 19–32)
Calcium: 7.7 mg/dL — ABNORMAL LOW (ref 8.4–10.5)
Creatinine, Ser: 0.85 mg/dL (ref 0.4–1.2)
GFR calc Af Amer: >60 mL/min (ref >60)
GFR calc non Af Amer: >60 mL/min (ref >60)

## 2010-03-29 LAB — CBC
MCH: 29.7 pg (ref 26.0–34.0)
MCHC: 31 g/dL (ref 30.0–36.0)
Platelets: 213 10*3/uL (ref 150–400)
RDW: 17.3 % — ABNORMAL HIGH (ref 11.5–15.5)

## 2010-03-30 ENCOUNTER — Inpatient Hospital Stay (HOSPITAL_COMMUNITY): Payer: Medicare Other

## 2010-03-30 LAB — CBC
MCH: 30.3 pg (ref 26.0–34.0)
Platelets: 179 10*3/uL (ref 150–400)
RBC: 2.94 MIL/uL — ABNORMAL LOW (ref 3.87–5.11)
WBC: 10.4 10*3/uL (ref 4.0–10.5)

## 2010-03-30 LAB — BASIC METABOLIC PANEL
Chloride: 101 mEq/L (ref 96–112)
Creatinine, Ser: 0.81 mg/dL (ref 0.4–1.2)
GFR calc Af Amer: >60 mL/min (ref >60)
Sodium: 139 mEq/L (ref 135–145)

## 2010-03-30 LAB — VANCOMYCIN, TROUGH: Vancomycin Tr: 15.2 ug/mL (ref 10.0–20.0)

## 2010-03-31 LAB — CBC
HCT: 26.2 % — ABNORMAL LOW (ref 36.0–46.0)
Platelets: 167 10*3/uL (ref 150–400)
RDW: 18.1 % — ABNORMAL HIGH (ref 11.5–15.5)
WBC: 12.1 10*3/uL — ABNORMAL HIGH (ref 4.0–10.5)

## 2010-03-31 LAB — BASIC METABOLIC PANEL
GFR calc non Af Amer: >60 mL/min (ref >60)
Glucose, Bld: 88 mg/dL (ref 70–99)
Potassium: 3.7 mEq/L (ref 3.5–5.1)
Sodium: 139 mEq/L (ref 135–145)

## 2010-04-01 LAB — TYPE AND SCREEN
Antibody Screen: POSITIVE
Unit division: 0

## 2010-04-01 LAB — CBC
HCT: 29.5 % — ABNORMAL LOW (ref 36.0–46.0)
HCT: 40.3 % (ref 36.0–46.0)
Hemoglobin: 13.2 g/dL (ref 12.0–15.0)
MCHC: 32.7 g/dL (ref 30.0–36.0)
MCV: 93.4 fL (ref 78.0–100.0)
RBC: 3.16 MIL/uL — ABNORMAL LOW (ref 3.87–5.11)
RDW: 16.6 % — ABNORMAL HIGH (ref 11.5–15.5)
WBC: 10.9 10*3/uL — ABNORMAL HIGH (ref 4.0–10.5)

## 2010-04-01 LAB — DIFFERENTIAL
Basophils Absolute: 0 10*3/uL (ref 0.0–0.1)
Basophils Relative: 0 % (ref 0–1)
Monocytes Relative: 2 % — ABNORMAL LOW (ref 3–12)
Neutro Abs: 6.7 10*3/uL (ref 1.7–7.7)
Neutrophils Relative %: 74 % (ref 43–77)

## 2010-04-01 LAB — COMPREHENSIVE METABOLIC PANEL
Alkaline Phosphatase: 59 U/L (ref 39–117)
BUN: 23 mg/dL (ref 6–23)
GFR calc non Af Amer: 56 mL/min — ABNORMAL LOW (ref >60)
Glucose, Bld: 99 mg/dL (ref 70–99)
Potassium: 3.4 mEq/L — ABNORMAL LOW (ref 3.5–5.1)
Total Protein: 6.4 g/dL (ref 6.0–8.3)

## 2010-04-01 LAB — CLOSTRIDIUM DIFFICILE BY PCR: Toxigenic C. Difficile by PCR: NEGATIVE

## 2010-04-01 LAB — BASIC METABOLIC PANEL
BUN: 14 mg/dL (ref 6–23)
Chloride: 106 mEq/L (ref 96–112)
Glucose, Bld: 104 mg/dL — ABNORMAL HIGH (ref 70–99)
Potassium: 3.1 mEq/L — ABNORMAL LOW (ref 3.5–5.1)

## 2010-04-01 LAB — URINALYSIS, ROUTINE W REFLEX MICROSCOPIC
Glucose, UA: NEGATIVE mg/dL
Leukocytes, UA: NEGATIVE
Nitrite: NEGATIVE
Protein, ur: 30 mg/dL — AB
Urobilinogen, UA: 0.2 mg/dL (ref 0.0–1.0)

## 2010-04-01 LAB — LIPASE, BLOOD: Lipase: 20 U/L (ref 11–59)

## 2010-04-01 LAB — URINE MICROSCOPIC-ADD ON

## 2010-04-02 LAB — GLUCOSE, CAPILLARY: Glucose-Capillary: 143 mg/dL — ABNORMAL HIGH (ref 70–99)

## 2010-04-02 LAB — CBC
HCT: 29.3 % — ABNORMAL LOW (ref 36.0–46.0)
MCH: 28.7 pg (ref 26.0–34.0)
MCHC: 30.7 g/dL (ref 30.0–36.0)
MCV: 93.3 fL (ref 78.0–100.0)
Platelets: 167 10*3/uL (ref 150–400)
RDW: 17.7 % — ABNORMAL HIGH (ref 11.5–15.5)

## 2010-04-02 LAB — BASIC METABOLIC PANEL
BUN: 12 mg/dL (ref 6–23)
Creatinine, Ser: 0.69 mg/dL (ref 0.4–1.2)
GFR calc non Af Amer: >60 mL/min (ref >60)
Glucose, Bld: 80 mg/dL (ref 70–99)

## 2010-04-03 ENCOUNTER — Inpatient Hospital Stay (HOSPITAL_COMMUNITY): Payer: Medicare Other

## 2010-04-03 LAB — CBC
MCHC: 32.8 g/dL (ref 30.0–36.0)
Platelets: 182 10*3/uL (ref 150–400)
RDW: 15.7 % — ABNORMAL HIGH (ref 11.5–15.5)

## 2010-04-03 LAB — URINE CULTURE: Colony Count: 25000

## 2010-04-03 LAB — COMPREHENSIVE METABOLIC PANEL
ALT: 21 U/L (ref 0–35)
Albumin: 3 g/dL — ABNORMAL LOW (ref 3.5–5.2)
Alkaline Phosphatase: 41 U/L (ref 39–117)
BUN: 24 mg/dL — ABNORMAL HIGH (ref 6–23)
Calcium: 8.4 mg/dL (ref 8.4–10.5)
Potassium: 3.7 mEq/L (ref 3.5–5.1)
Sodium: 140 mEq/L (ref 135–145)
Total Protein: 5.7 g/dL — ABNORMAL LOW (ref 6.0–8.3)

## 2010-04-03 LAB — DIFFERENTIAL
Basophils Absolute: 0.1 K/uL (ref 0.0–0.1)
Basophils Relative: 0 % (ref 0–1)
Eosinophils Absolute: 0.1 K/uL (ref 0.0–0.7)
Eosinophils Relative: 1 % (ref 0–5)
Lymphocytes Relative: 18 % (ref 12–46)
Lymphs Abs: 2.6 K/uL (ref 0.7–4.0)
Monocytes Absolute: 0.3 K/uL (ref 0.1–1.0)
Monocytes Relative: 2 % — ABNORMAL LOW (ref 3–12)
Neutro Abs: 11.3 K/uL — ABNORMAL HIGH (ref 1.7–7.7)
Neutrophils Relative %: 79 % — ABNORMAL HIGH (ref 43–77)

## 2010-04-03 LAB — POCT I-STAT, CHEM 8
BUN: 24 mg/dL — ABNORMAL HIGH (ref 6–23)
Calcium, Ion: 1.08 mmol/L — ABNORMAL LOW (ref 1.12–1.32)
Chloride: 104 meq/L (ref 96–112)
Creatinine, Ser: 0.9 mg/dL (ref 0.4–1.2)
Glucose, Bld: 80 mg/dL (ref 70–99)
HCT: 42 % (ref 36.0–46.0)
Hemoglobin: 14.3 g/dL (ref 12.0–15.0)
Potassium: 3.6 meq/L (ref 3.5–5.1)
Sodium: 140 meq/L (ref 135–145)
TCO2: 32 mmol/L (ref 0–100)

## 2010-04-03 LAB — BASIC METABOLIC PANEL
Calcium: 7.8 mg/dL — ABNORMAL LOW (ref 8.4–10.5)
Creatinine, Ser: 0.58 mg/dL (ref 0.4–1.2)
GFR calc Af Amer: >60 mL/min (ref >60)
GFR calc non Af Amer: >60 mL/min (ref >60)

## 2010-04-03 LAB — POCT CARDIAC MARKERS
CKMB, poc: <1 ng/mL — ABNORMAL LOW (ref 1.0–8.0)
Myoglobin, poc: 23.8 ng/mL (ref 12–200)
Troponin i, poc: <0.05 ng/mL (ref 0.00–0.09)

## 2010-04-03 LAB — URINE MICROSCOPIC-ADD ON

## 2010-04-03 LAB — URINALYSIS, ROUTINE W REFLEX MICROSCOPIC
Bilirubin Urine: NEGATIVE
Glucose, UA: NEGATIVE mg/dL
Ketones, ur: NEGATIVE mg/dL
Nitrite: NEGATIVE
Specific Gravity, Urine: 1.017 (ref 1.005–1.030)
pH: 6.5 (ref 5.0–8.0)

## 2010-04-03 LAB — GLUCOSE, CAPILLARY: Glucose-Capillary: 78 mg/dL (ref 70–99)

## 2010-04-03 LAB — MAGNESIUM: Magnesium: 1.2 mg/dL — ABNORMAL LOW (ref 1.5–2.5)

## 2010-04-03 LAB — LACTIC ACID, PLASMA: Lactic Acid, Venous: 1.1 mmol/L (ref 0.5–2.2)

## 2010-04-05 LAB — BASIC METABOLIC PANEL
BUN: 4 mg/dL — ABNORMAL LOW (ref 6–23)
CO2: 27 mEq/L (ref 19–32)
Glucose, Bld: 83 mg/dL (ref 70–99)
Potassium: 3.2 mEq/L — ABNORMAL LOW (ref 3.5–5.1)
Sodium: 141 mEq/L (ref 135–145)

## 2010-04-05 LAB — URINALYSIS, MICROSCOPIC ONLY
Bilirubin Urine: NEGATIVE
Leukocytes, UA: NEGATIVE
Nitrite: NEGATIVE
Specific Gravity, Urine: 1.012 (ref 1.005–1.030)
Urobilinogen, UA: 0.2 mg/dL (ref 0.0–1.0)
pH: 7 (ref 5.0–8.0)

## 2010-04-05 LAB — CULTURE, BLOOD (ROUTINE X 2)
Culture  Setup Time: 201203160114
Culture: NO GROWTH

## 2010-04-05 LAB — CBC
MCV: 94.6 fL (ref 78.0–100.0)
Platelets: 116 10*3/uL — ABNORMAL LOW (ref 150–400)
RBC: 2.97 MIL/uL — ABNORMAL LOW (ref 3.87–5.11)
RDW: 16.9 % — ABNORMAL HIGH (ref 11.5–15.5)
WBC: 3.3 10*3/uL — ABNORMAL LOW (ref 4.0–10.5)

## 2010-04-09 ENCOUNTER — Other Ambulatory Visit (HOSPITAL_COMMUNITY): Payer: Self-pay | Admitting: Internal Medicine

## 2010-04-09 LAB — URINE CULTURE
Culture  Setup Time: 201203221109
Special Requests: POSITIVE

## 2010-04-11 NOTE — Progress Notes (Signed)
[name], [name]                 ACCOUNT NO.:  [id number]  MEDICAL RECORD NO.:  [id number]           PATIENT TYPE:  I  LOCATION:  1303                         FACILITY:  [facility]  PHYSICIAN:  Isidor Holts, M.D.  DATE OF BIRTH:  1922/03/06                                PROGRESS NOTE   DATE OF DISCHARGE: To be determined.  PRIMARY MD: Beverley Fiedler MD, [facility].  PRIMARY GASTROENTEROLOGIST: Bernette Redbird, MD, [facility].  DISCHARGE DIAGNOSES: 1. Healthcare-associated pneumonia. 2. Medication-induced neutropenia secondary to Flagyl and possibly     ciprofloxacin, resolved. 3. History of esophageal dysmotility/peptic stricture, status post     esophagogastroduodenoscopy/Botox injection on March 16, 2010. 4. Odynophagia, treated empirically for candidiasis. 5. Failure to thrive/protein-calorie malnutrition. 6. History of recent acute sigmoid diverticulitis, resolved. 7. Hypertension. 8. Coronary artery disease. 9. Atrial fibrillation, not a Coumadin candidate. 10.Dyslipidemia. 11.Anemia of chronic disease, required transfusion of 1 unit PRBC on     March 31, 2010. 12.History of chronic pain. 13.History of chronic vertigo. 14.Rheumatoid arthritis. 15.Chronic steroid treatment. 16.History of cystic thyroid mass.  DISCHARGE MEDICATIONS: These will be listed in an addendum at the appropriate time, by discharging MD.  CONSULTATIONS: Gastroenterologist, Dr. Willis Modena.  PROCEDURES: 1. Chest x-ray March 26, 2010:  This showed new right lung base     peribronchovascular opacity suspicious for acute bronchopneumonia. 2. Chest x-ray March 27, 2010:  This showed PICC line in good     position, slight progression of patchy infiltrate at the right     base.  Left lung is clear. 3. Chest x-ray March 30, 2010:  This showed no pulmonary edema.  There     were small bilateral pleural effusion with bilateral basilar     atelectasis or infiltrates.  ADMISSION  HISTORY: As in H and P notes of March 26, 2010, dictated by Dr. Vania Rea. However, in brief, this is an 77 year old female, with known history of recent sigmoid diverticulitis, esophageal dysmotility/stricture, status post EGD and Botox injection March 16, 2010, cystic thyroid mass, anemia of chronic disease, protein-calorie malnutrition, rheumatoid arthritis, on chronic steroid treatment, hypertension, atrial fibrillation; coronary artery disease; chronic back pain, dyslipidemia, chronic vertigo, presenting with increasing cough and weakness.  She also sustained a fall on March 25, 2010.  The patient was seen by Dr. Juluis Rainier in [facility]' office on March 26, 2010.  Chest x- ray was done, which suggested a bronchopneumonia and the patient was sent to [facility] as a direct admission, to the hospitalist service.  CLINICAL COURSE: 1. Healthcare-associated pneumonia:  The patient is status post recent     hospitalization from 03/13/2010 to 03/22/2010.  She now presents     with a bronchopneumonia.  Imaging studies confirmed right lung base     bronchopneumonia.  She was therefore managed with broad-spectrum     antibiotic therapy, consisting of intravenous vancomycin and Zosyn.     Over the course of the next few days, her clinical condition     improved.  Cough became considerably ameliorated.  White cell count  dropped gradually, and as of April 01, 2010, had normalized at     10.9.  She had no pyrexia episodes during the course of her     hospitalization and clinically she was considered as significantly     improved.  As of 04/01/2010, she was transitioned to monotherapy     with Avelox on day #6 of a planned 10-day course of antibiotic     therapy.  2. Medication-induced neutropenia:  The patient on 03/26/2010 had a     white cell count which was quite low at 2.5.  This was deemed     secondary to Flagyl and perhaps ciprofloxacin.  These  medications     were discontinued and over the course of her hospitalization, the     patient's white cell count normalized, reached a high of 12.1 on     03/31/2010, but by 04/01/2010 had normalized at 10.9.  3. Odynophagia:  The patient did complain of odynophagia during the     course of her hospitalization, characterized by painful swallowing.     Initially, she was managed with lidocaine gel which did not seem to     be very helpful and it was militating against adequate oral intake.     A GI consultation was therefore requested.  This was kindly     provided by Dr. Willis Modena, who opined that there was no     indication for EGD at the present time, and recommended empiric     treatment with a 10-day course of diflucan therapy for possible thrush, and this was     instituted accordingly.  As of 04/01/2010, the patient continues to     be symptomatic, although there has been some improvement.  4. Failure to thrive/protein-calorie malnutrition:  This is     problematic.  The patient continues to have poor oral intake, has     no appetite  and is clearly malnourished.  Megace has been added to     her medications in an attempt to improve her appetite; however, it     is beginning to appear that she may need supplemental tube feeding,     perhaps via a PEG tube.  Certainly, Dr. Dulce Sellar has recommended to     consider this approach.  5. History of recent sigmoid diverticulitis:  This has resolved.  The     patient has no symptoms referable to this.  Of note, C difficile     PCR was negative during this hospitalization.  6. Hypertension:  This remained controlled during the course of the     patient's hospitalization.  7. Coronary artery disease:  The patient was asymptomatic from this     viewpoint.  8. Atrial fibrillation:  The patient does remain rate controlled.  She     is not deemed a Coumadin candidate.  9. Anemia of chronic disease:  The patient has known anemia of  chronic     disease and at presentation had a hemoglobin of 8.6.  Over the     course of her hospitalization, she experienced gradual diminution     and hemoglobin was 8.1 on 03/31/2010.  Given her overall debility     and protein-calorie malnutrition, it was felt that she would     benefit from transfusion of 1 unit of PRBC.  This was carried out     on 03/31/2010, and as of 04/01/2010, there was satisfactory post     transition bump in hemoglobin  to 9.3.  10.History of chronic pain syndrome:  This did not prove problematic.  11.History of rheumatoid arthritis:  The patient is on chronic steroid     treatment for this, as well as once weekly methotrexate therapy.  In     the initial part of her hospitalization, she was placed on a     doubled dose of prednisone to cover the stress of her acute     illness.  However, with clinic improvement she been changed back to     her maintenance dose of 7.5 mg of prednisone daily.  DISPOSITION: The patient as of 04/01/2010 has clearly improved from the point of view of pneumonia.  However, failure to thrive as well as continued odynophagia, continue to be an issue.  Provided these issues can be satisfactorily resolved, it is anticipated that the patient can be discharged to skilled nursing facility.  Dr. Willis Modena has expressed his intention of reevaluating the patient on 04/02/2010, and depending on his recommendations, decision will be made as to whether or not to go ahead with PEG tube feeding.  Details will be appended in an addendum at the appropriate time, by discharging MD.     Isidor Holts, M.D.     CO/MEDQ  D:  04/01/2010  T:  04/01/2010  Job:  [id number]  cc:   Fanny Dance. Rankins, M.D. Fax: [phone]  Bernette Redbird, M.D. Fax: [phone]  Electronically Signed by Isidor Holts M.D. on 04/11/2010 02:49:33 PM

## 2010-04-17 NOTE — Discharge Summary (Signed)
[name], [name]                 ACCOUNT NO.:  [id number]  MEDICAL RECORD NO.:  [id number]           PATIENT TYPE:  I  LOCATION:  1303                         FACILITY:  [facility]  PHYSICIAN:  Kathlen Mody, MD       DATE OF BIRTH:  Jul 12, 1922  DATE OF ADMISSION:  03/26/2010 DATE OF DISCHARGE:  04/05/2010                              DISCHARGE SUMMARY   Discharge diagnoses:   1. Healthcare-associated pneumonia.   2. Medication-induced neutropenia secondary to Flagyl and possibly       ciprofloxacin, resolved.   3. History of esophageal dysmotility/peptic stricture, status post       esophagogastroduodenoscopy/Botox injection on March 16, 2010.   4. Odynophagia, treated empirically for candidiasis.   5. Failure to thrive/protein-calorie malnutrition.   6. History of recent acute sigmoid diverticulitis, resolved.   7. Hypertension.   8. Coronary artery disease.   9. Atrial fibrillation, not a Coumadin candidate.   10.Dyslipidemia.   11.Anemia of chronic disease, required transfusion of 1 unit PRBC on       March 31, 2010.   12.History of chronic pain.   13.History of chronic vertigo.   14.Rheumatoid arthritis.   15.Chronic steroid treatment.   16.History of cystic thyroid mass  Discharge Medications: 1. bisacodyl 10mg  prn 2. tussinex suspension 5 ml every 12 h prn 3. Lisinopirl 10mg  daily 4. Megave 400mg  daily 5. Senna 2 tab at bedtime prn 6. calcium carbonate/multivitamin  2 tab daily 7. Crestor 10mg  at bedtime 8. Folic acid 0.4 mg 3 tab daily 9. Hydroxychlorquine 200mg  2 tab daily 10.Meclizine 25mg  1 tab fourtimes a day  prn 11.Metoprolol 100mg  twice a day 12.Miralax 17 gm prn 13.Multivitamin  1 tab daily 14. Zofran 4 mg every 4 hours prn 15.phenylephrine 10mg  daily as needed 16. Potassium chloride 10 meq daily 17.Prednisone 7.5mg  daily 18.Protonix 40mg  daily 19.Simethicone 1 tab three times prn 20.Tramadol 1 to 2 tab three times a day prn 21. Tylenol 500mg  2 tab  every 6 hours prn   CONSULTATIONS:   Gastroenterologist, Dr. Willis Modena.   PROCEDURES:   1. Chest x-ray March 26, 2010:  This showed new right lung base       peribronchovascular opacity suspicious for acute bronchopneumonia.   2. Chest x-ray March 27, 2010:  This showed PICC line in good       position, slight progression of patchy infiltrate at the right       base.  Left lung is clear.   3. Chest x-ray March 30, 2010:  This showed no pulmonary edema.  There       were small bilateral pleural effusion with bilateral basilar       atelectasis or infiltrates.   ADMISSION HISTORY:   As in H and P notes of March 26, 2010, dictated by Dr. Vania Rea.   However, in brief, this is an 77 year old female, with known history of   recent sigmoid diverticulitis, esophageal dysmotility/stricture, status   post EGD and Botox injection March 16, 2010, cystic thyroid mass, anemia   of chronic disease, protein-calorie malnutrition, rheumatoid arthritis,   on  chronic steroid treatment, hypertension, atrial fibrillation;   coronary artery disease; chronic back pain, dyslipidemia, chronic   vertigo, presenting with increasing cough and weakness.  She also   sustained a fall on March 25, 2010.  The patient was seen by Dr.   Juluis Rainier in [facility]' office on March 26, 2010.  Chest x-   ray was done, which suggested a bronchopneumonia and the patient was sent   to [facility] as a direct admission, to the hospitalist   service.   CLINICAL COURSE:   1. Healthcare-associated pneumonia:  The patient is status post recent       hospitalization from 03/13/2010 to 03/22/2010.  She now presents       with a bronchopneumonia.  Imaging studies confirmed right lung base       bronchopneumonia.  She was therefore managed with broad-spectrum       antibiotic therapy, consisting of intravenous vancomycin and Zosyn.       Over the course of the next few days, her clinical  condition       improved.  Cough became considerably ameliorated.  White cell count       dropped gradually, and as of April 01, 2010, had normalized at       10.9.  She had no pyrexia episodes during the course of her       hospitalization and clinically she was considered as significantly       improved.  As of 04/01/2010, she was transitioned to monotherapy       with Avelox on day #6 of a planned 10-day course of antibiotic       therapy.   2. Medication-induced neutropenia:  The patient on 03/26/2010 had a       white cell count which was quite low at 2.5.  This was deemed       secondary to Flagyl and perhaps ciprofloxacin.  These medications       were discontinued and over the course of her hospitalization, the       patient's white cell count normalized, reached a high of 12.1 on       03/31/2010, but by 04/01/2010 had normalized at 10.9.        Pancytopenia was most likely secondary to her medications, the     methotrexate, so methotrexate is being held right now, in addition     to Plavix, and she is recommended to follow with her primary care     physician in 1-2 weeks after discharge today.  3. Odynophagia:  The patient did complain of odynophagia during the       course of her hospitalization, characterized by painful swallowing.       Initially, she was managed with lidocaine gel which did not seem to       be very helpful and it was militating against adequate oral intake.       A GI consultation was therefore requested.  This was kindly       provided by Dr. Willis Modena, who opined that there was no       indication for EGD at the present time, and recommended empiric       treatment with a 10-day course of diflucan therapy for possible thrush, and this was       instituted accordingly.  As of 04/01/2010, the patient continues to       be symptomatic, although there has been some improvement.  Gastroenterology consult was reconsulted, who made a     decision to  do an endoscopy on April 05, 2010, by Dr. Madilyn Fireman, but as     the patient was on Plavix already, the procedure was cancelled and     she was asked to follow up as an outpatient with Dr. Donavan Burnet     office in about a week where they will be able to schedule the     procedure, the endoscopy with esophageal dilatation, as an     outpatient.  The patient had a modified barium swallow test done on     air on April 08, 2010, which showed distal esophageal constriction     with minimal risk to aspiration and she was given recommendations     on her eating habits.  4. Failure to thrive/protein-calorie malnutrition:  This is       problematic.  The patient continues to have poor oral intake, has       no appetite  and is clearly malnourished.  Megace has been added to       her medications in an attempt to improve her appetite; however, it       is beginning to appear that she may need supplemental tube feeding,       perhaps via a PEG tube.  Certainly, Dr. Dulce Sellar has recommended to       consider this approach.   5. History of recent sigmoid diverticulitis:  This has resolved.  The       patient has no symptoms referable to this.  Of note, C difficile       PCR was negative during this hospitalization.   6. Hypertension:  This remained controlled during the course of the       patient's hospitalization.   7. Coronary artery disease:  The patient was asymptomatic from this       viewpoint.   8. Atrial fibrillation:  The patient does remain rate controlled.  She       is not deemed a Coumadin candidate.   9. Anemia of chronic disease:  The patient has known anemia of chronic       disease and at presentation had a hemoglobin of 8.6.  Over the       course of her hospitalization, she experienced gradual diminution       and hemoglobin was 8.1 on 03/31/2010.  Given her overall debility       and protein-calorie malnutrition, it was felt that she would       benefit from transfusion of 1 unit of  PRBC.  This was carried out       on 03/31/2010, and as of 04/01/2010, there was satisfactory post       transition bump in hemoglobin to 9.3.   10.History of chronic pain syndrome:  This did not prove problematic.   11.History of rheumatoid arthritis:  The patient is on chronic steroid       treatment for this, as well as once weekly methotrexate therapy.  In       the initial part of her hospitalization, she was placed on a       doubled dose of prednisone to cover the stress of her acute       illness.  However, with clinic improvement she been changed back to       her maintenance dose of 7.5 mg of prednisone daily.   DISPOSITION:   PHYSICAL EXAMINATION:  VITAL SIGNS:  On the day of discharge, the patient's vitals include temperature of 98.4, pulse of 86, blood pressure 121/83, respirations 18, and saturating 98% of 2 L of nasal cannula. GENERAL:  She is alert, afebrile, comfortable, no acute distress. CARDIOVASCULAR:  S1-S2 heard.  No rubs, murmurs, or gallops. RESPIRATORY:  Decreased air entry at bases.  No rhonchi or wheezing. ABDOMEN:  Soft, nontender, and nondistended.  Good bowel sounds. EXTREMITIES:  No pedal edema, chronic venous stasis changes.  At this time, the patient is hemodynamically stable for discharge to short-term rehab nursing home facility, and she is scheduled to follow up with her primary care physician in about a week to address her pancytopenia issue secondary to her medication and also scheduled to follow up with Dr. Matthias Hughs, [facility] in about a week to schedule the procedure for endoscopy with dilatation.   AFter patient was discharged to the snf, received a call on 04/09/2010 from the lab, stating [name] urine from 04/05/2010 grew 25,000 colonies of VRE, sensitive panel in the E chart. Contacted [facility] and spoke to pt's nurse Sue Jones, [name] stated [name] is asymptomatic and does not have foley cathether. Contacted   and discussed with the Nursing Home MD, Dr Kevan Ny. Given that the pt is not symptomatic, and the urine culture was growing only 25,000 colonies,Dr Kevan Ny indicated not to  treat with antibiotics at this time and said  he would repeat urine culture and follow up further.           ______________________________ Kathlen Mody, MD     VA/MEDQ  D:  04/05/2010  T:  04/05/2010  Job:  [id number]  Electronically Signed by Kathlen Mody MD on 04/17/2010 09:57:54 PM

## 2010-04-24 LAB — URINALYSIS, ROUTINE W REFLEX MICROSCOPIC
Ketones, ur: 15 mg/dL — AB
Nitrite: NEGATIVE
Urobilinogen, UA: 1 mg/dL (ref 0.0–1.0)

## 2010-04-24 LAB — COMPREHENSIVE METABOLIC PANEL
ALT: 9 U/L (ref 0–35)
AST: 23 U/L (ref 0–37)
Albumin: 2.8 g/dL — ABNORMAL LOW (ref 3.5–5.2)
Alkaline Phosphatase: 55 U/L (ref 39–117)
Chloride: 101 mEq/L (ref 96–112)
GFR calc Af Amer: >60 mL/min (ref >60)
Potassium: 3.2 mEq/L — ABNORMAL LOW (ref 3.5–5.1)
Sodium: 137 mEq/L (ref 135–145)
Total Protein: 5.6 g/dL — ABNORMAL LOW (ref 6.0–8.3)

## 2010-04-24 LAB — CBC
Platelets: 194 10*3/uL (ref 150–400)
RDW: 15 % (ref 11.5–15.5)
WBC: 9 10*3/uL (ref 4.0–10.5)

## 2010-04-24 LAB — CULTURE, BLOOD (ROUTINE X 2): Culture: NO GROWTH

## 2010-04-24 LAB — DIFFERENTIAL
Basophils Relative: 1 % (ref 0–1)
Eosinophils Absolute: 0.1 10*3/uL (ref 0.0–0.7)
Eosinophils Relative: 2 % (ref 0–5)
Monocytes Absolute: 0.2 10*3/uL (ref 0.1–1.0)
Monocytes Relative: 3 % (ref 3–12)
Neutro Abs: 7.1 10*3/uL (ref 1.7–7.7)

## 2010-04-24 LAB — URINE CULTURE

## 2010-04-26 LAB — URINE CULTURE

## 2010-04-26 LAB — DIFFERENTIAL
Basophils Relative: 0 % (ref 0–1)
Eosinophils Relative: 3 % (ref 0–5)
Monocytes Absolute: 0.5 10*3/uL (ref 0.1–1.0)
Monocytes Relative: 7 % (ref 3–12)
Neutro Abs: 5.4 10*3/uL (ref 1.7–7.7)

## 2010-04-26 LAB — URINE MICROSCOPIC-ADD ON

## 2010-04-26 LAB — URINALYSIS, ROUTINE W REFLEX MICROSCOPIC
Glucose, UA: NEGATIVE mg/dL
Nitrite: NEGATIVE
Specific Gravity, Urine: 1.006 (ref 1.005–1.030)
pH: 7 (ref 5.0–8.0)

## 2010-04-26 LAB — POCT CARDIAC MARKERS: CKMB, poc: <1 ng/mL — ABNORMAL LOW (ref 1.0–8.0)

## 2010-04-26 LAB — CBC
HCT: 37.2 % (ref 36.0–46.0)
Hemoglobin: 12.1 g/dL (ref 12.0–15.0)
MCHC: 32.7 g/dL (ref 30.0–36.0)
MCV: 95 fL (ref 78.0–100.0)
RBC: 3.91 MIL/uL (ref 3.87–5.11)

## 2010-04-26 NOTE — H&P (Signed)
[name], [name] NO.:  [id number]  MEDICAL RECORD NO.:  [id number]          PATIENT TYPE:  INP  LOCATION:                               FACILITY:  [facility]  PHYSICIAN:  Massie Maroon, MD        DATE OF BIRTH:  [birth year]  DATE OF ADMISSION: DATE OF DISCHARGE:                             HISTORY & PHYSICAL   CHIEF COMPLAINT:  "I have got abdominal pain."  HISTORY OF PRESENT ILLNESS:  This 77 year old female with a history of rheumatoid arthritis and question of esophageal stricture in the past apparently was seen today by Dr. Bernette Redbird and underwent upper endoscopy with balloon and Savary dilation of the esophagus.  When she arrived home, she apparently had a bout of nausea, vomiting and abdominal pain and called 911.  The patient was brought to the emergency room for evaluation.  CT scan of the chest, abdomen and pelvis showed findings most consistent with early acute sigmoid diverticulitis, ischemia is less likely given the distribution.  There was no evidence of esophageal injury or perforation.  Also noted was a large thyroid nodule and an ultrasound was recommended.  The patient will be admitted for diverticulitis.  PAST MEDICAL HISTORY: 1. Rheumatoid arthritis. 2. Hypertension. 3. Atrial fibrillation, not on Coumadin. 4. CAD, status post MI. 5. Anemia. 6. Esophageal stricture, status post dilation. 7. UTI. 8. Sinusitis. 9. Lower back pain. 10.Pneumonia. 11.History of diverticulitis.  PAST SURGICAL HISTORY: 1. Hysterectomy. 2. Cataract surgery. 3. Wrist surgery. 4. Foot surgery.  SOCIAL HISTORY:  The patient lives with son.  She does not drink or smoke.  She does not do drugs.  FAMILY HISTORY:  Mother died at age 74 of old age.  Father died in his 50s of gangrene, status post amputation.  ALLERGIES:  SULFA.  MEDICATIONS:  Please see med rec.  REVIEW OF SYSTEMS:  Negative for all 10 organ systems except for pertinent positives  stated above.  PHYSICAL EXAMINATION:  VITAL SIGNS:  Temperature 98.0, pulse 80, blood pressure is 145/84, pulse ox is 99% on room air. HEENT:  Anicteric. NECK:  No JVD. HEART:  Regular rate and rhythm.  S1, S2. LUNGS:  Clear to auscultation bilaterally. ABDOMEN:  Soft, nontender, nondistended.  Positive bowel sounds. EXTREMITIES:  No cyanosis, clubbing, or edema. SKIN:  Venous stasis changes, seborrheic keratosis on the back.  LABORATORY DATA:  WBC 9.2, hemoglobin 10.3, platelet count is 191. Sodium 139, potassium 3.2, BUN 24, creatinine 1.87, calcium 8.6, AST 32, ALT 15, alk phos 43, total bilirubin 0.9, lipase 54.  CT scan of the abdomen and pelvis shows acute sigmoid diverticulitis and severe spinal stenosis at L3-L4.  ASSESSMENT AND PLAN: 1. Acute sigmoid diverticulitis:  The patient will be treated with     Cipro 400 mg IV b.i.d. along with Flagyl 500 mg IV t.i.d.. 2. Thyroid nodule (right):  Check a thyroid ultrasound. 3. Esophageal stricture, status post dilation:  Stable. 4. Anemia:  Repeat a CBC in a.m. 5. Hypokalemia:  Status post repletion in the ED. 6. Rheumatoid arthritis:  Continue hydroxychloroquine, methotrexate,  and prednisone. 7. Coronary artery disease, status post myocardial infarction:     Continue Plavix, metoprolol, Crestor. 8. Anxiety:  Continue Xanax as needed. 9. Deep vein thrombosis prophylaxis.  SCDs.     Massie Maroon, MD     JYK/MEDQ  D:  03/14/2010  T:  03/14/2010  Job:  [id number]  cc:   Fanny Dance. Rankins, M.D. Fax: [phone]  Bernette Redbird, M.D. Fax: [phone]  Zenovia Jordan, MD Fax: [phone]  Electronically Signed by Pearson Grippe MD on 04/26/2010 01:27:23 AM

## 2010-04-30 LAB — URINALYSIS, ROUTINE W REFLEX MICROSCOPIC
Ketones, ur: 15 mg/dL — AB
Leukocytes, UA: NEGATIVE
Nitrite: NEGATIVE
Protein, ur: 30 mg/dL — AB

## 2010-04-30 LAB — CBC
HCT: 43.3 % (ref 36.0–46.0)
MCV: 99.1 fL (ref 78.0–100.0)
Platelets: 178 10*3/uL (ref 150–400)
RDW: 15.8 % — ABNORMAL HIGH (ref 11.5–15.5)

## 2010-04-30 LAB — LIPASE, BLOOD: Lipase: 20 U/L (ref 11–59)

## 2010-04-30 LAB — COMPREHENSIVE METABOLIC PANEL
ALT: 12 U/L (ref 0–35)
Albumin: 3.3 g/dL — ABNORMAL LOW (ref 3.5–5.2)
Alkaline Phosphatase: 48 U/L (ref 39–117)
Glucose, Bld: 97 mg/dL (ref 70–99)
Potassium: 3.7 mEq/L (ref 3.5–5.1)
Sodium: 142 mEq/L (ref 135–145)
Total Protein: 5.6 g/dL — ABNORMAL LOW (ref 6.0–8.3)

## 2010-04-30 LAB — DIFFERENTIAL
Basophils Absolute: 0 10*3/uL (ref 0.0–0.1)
Eosinophils Absolute: 0.2 10*3/uL (ref 0.0–0.7)
Eosinophils Relative: 1 % (ref 0–5)

## 2010-05-09 NOTE — Consult Note (Signed)
NAMEAYERIM, BERQUIST NO.:  [id number]  MEDICAL RECORD NO.:  [id number]           PATIENT TYPE:  I  LOCATION:  1303                         FACILITY:  [facility]  PHYSICIAN:  Willis Modena, MD     DATE OF BIRTH:  1922-07-09  DATE OF CONSULTATION:  03/29/2010 DATE OF DISCHARGE:                                CONSULTATION   REASON FOR CONSULTATION:  Failure to thrive and odynophagia.  CHIEF COMPLAINT:  Chest pain.  HISTORY OF PRESENT ILLNESS:  Ms. Rizzolo is an 77 year old female with multiple medical problems including rheumatoid arthritis and atrial fibrillation.  She is on chronic Plavix therapy.  She presents for evaluation of shortness of breath and was found to have pneumonia.  We were asked to see Ms. Burnstein because of complaints of odynophagia.  Upon talking with Ms. Vickers, she tells me that she has had pain with swallowing, chest pain and trouble swallowing for many years.  She has profound esophageal dysmotility.  Barium esophagram studies with tablet have shown vague hang-ups of the tablet in the upper esophagus.  She has had several endoscopies with empiric dilatations as well as endoscopy with Botox injections, none of which have appreciably helped her.  PAST MEDICAL HISTORY:  From dictated note of Dr. Vania Rea on March 26, 2010.  I have reviewed and I agree.  PAST SURGICAL HISTORY:  From dictated note of Dr. Vania Rea on March 26, 2010.  I have reviewed and I agree.  MEDICATIONS:  From dictated note of Dr. Vania Rea on March 26, 2010.  I have reviewed and I agree.  ALLERGIES:  From dictated note of Dr. Vania Rea on March 26, 2010.  I have reviewed and I agree.  FAMILY HISTORY:  From dictated note of Dr. Vania Rea on March 26, 2010.  I have reviewed and I agree.  SOCIAL HISTORY:  From dictated note of Dr. Vania Rea on March 26, 2010.  I have reviewed and I agree.  REVIEW OF SYSTEMS:  From dictated note  of Dr. Vania Rea on March 26, 2010.  I have reviewed and I agree.  PHYSICAL EXAMINATION:  VITAL SIGNS:  Blood pressure 136/87, heart rate 97, respiratory rate 18, temperature 97.9, oxygen 90% on room air. GENERAL:  Ms. Roam is chronically ill-appearing, but does not appear acutely toxic. ENT:  Normocephalic, atraumatic.  No obvious thrush. NECK:  Supple but thick. LUNGS:  Faint bibasilar breath sounds. HEART:  Irregularly irregular. ABDOMEN:  Soft. NEUROLOGIC:  Diffusely weak but nonfocal without lateralizing signs. SKIN:  Scattered ecchymoses and telangiectasias.  LABORATORY DATA:  Hemoglobin is 9.0, white count 8.0, platelet count 213, INR 1.2.  Sodium 137, potassium 2.9, chloride 111, bicarbonate 24, BUN 8, creatinine 0.5.  Her albumin a few days ago was 2.5.  RADIOLOGIC STUDIES:  She has a chest x-ray showing right basilar infiltrate.  IMPRESSION:  Ms. Tafolla is an 77 year old female with multiple medical problems presenting with shortness of breath and failure to thrive.  It appears she most likely has an aspiration pneumonia in the setting of her profound esophageal dysmotility.  Her  dysphagia and odynophagia symptoms have not appreciably changed over a long period of time.  She has had several endoscopies, the last about two weeks ago with Botox injection to her gastroesophageal junction, all of which have not been helpful.  PLAN: 1. Continue trials at feeding. 2. Would administer a 10-day course of empiric Diflucan for possible     overlapping Candida esophagitis, as she was recently treated with     antibiotics for diverticulitis. 3. I find no need to repeat endoscopy at this time. 4. If her poor oral nutrition persists, one might have to consider     Interventional Radiology placing a PEG tube some time in the next     week or so.  I suspect that she will keep having troubles with     recurrent aspiration pneumonia and recurrent malnutrition given her      profound esophageal dysmotility.  I have asked Ms. Dreibelbis to discuss     all of this with her son, who helps her make her medical decisions.  Thank you again for allowing [place] to participate in Ms. Charnley's care.     Willis Modena, MD     WO/MEDQ  D:  03/29/2010  T:  03/29/2010  Job:  [id number]  Electronically Signed by Willis Modena  on 05/09/2010 01:59:02 PM

## 2010-05-29 NOTE — Discharge Summary (Signed)
[name], [name]                 ACCOUNT NO.:  [id number]   MEDICAL RECORD NO.:  [id number]          PATIENT TYPE:  INP   LOCATION:  1535                         FACILITY:  [facility]   PHYSICIAN:  Corinna L. Lendell Caprice, MDDATE OF BIRTH:  1922/05/05   DATE OF ADMISSION:  10/16/2006  DATE OF DISCHARGE:  10/19/2006                               DISCHARGE SUMMARY   DISCHARGE DIAGNOSES:  1. Gastroenteritis.  2. Rheumatoid arthritis.  3. Hypertension.  4. Hyperlipidemia.  5. Hypokalemia.   FOLLOW UP:  Follow up with Dr. Matthias Hughs as previously scheduled.   ACTIVITY:  No heavy exertion.   DIET:  As tolerated.   CONDITION ON DISCHARGE:  Stable.   CONSULTATIONS:  Dr. Bosie Clos.   PROCEDURES:  None.   LABORATORY DATA AND X-RAY FINDINGS:  Lab stool culture negative to date.  C. difficile negative.  Fecal lactoferrin positive.  One out of two  stools Hemoccult positive.  Urine culture negative.  Blood cultures  negative.  CBC unremarkable.  Complete metabolic panel on admission  significant for potassium of 3.2 after repletion 3.9, lipase 24.  Urinalysis with 30 protein, negative ketones, specific gravity 1.020,  negative nitrite, negative leukocyte esterase.   CT of the abdomen and pelvis showed mildly distended bowel containing  and generous amount of fluid and mild pelvic ascites.  Minimal  periportal edema.  Abdominal aortic aneurysm 2.6 cm.   HISTORY OF PRESENT ILLNESS:  [name] is an 77 year old white female  with history of rheumatoid arthritis on methotrexate and prednisone who  presented to the emergency room with sudden-onset of vomiting and  diarrhea.  Please see H&P for complete admission details.  Her  temperature in the emergency room was 101.6.  She had a hospitalization  several months ago with similar symptoms and CAT scan at that time had  shown thickened ileum.  The etiology was not entirely clear at that  time.   HOSPITAL COURSE:  She was admitted and she had some  mild tenderness in  the lower abdomen, but her abdomen was soft, no peritoneal signs and  nondistended.  She was started empirically on antibiotics and kept  n.p.o.  She was given IV fluids and supportive care.  The cultures have  been negative and she has not been febrile.  Her diet  has been advanced and she is feeling better and requesting to go home.  She has an appointment with Dr. Matthias Hughs scheduled previously which I  have asked her to keep.  GI consulted and agreed with empiric  antibiotics, but felt this was most likely a viral gastroenteritis.      Corinna L. Lendell Caprice, MD  Electronically Signed     CLS/MEDQ  D:  10/19/2006  T:  10/20/2006  Job:  [id number]   cc:   Vikki Ports, M.D.  Fax: [phone]   Bernette Redbird, M.D.  Fax: [phone]

## 2010-05-29 NOTE — Consult Note (Signed)
[name], Jones NO.:  [id number]   MEDICAL RECORD NO.:  [id number]          PATIENT TYPE:  INP   LOCATION:  5528                         FACILITY:  [facility]   PHYSICIAN:  Bernette Redbird, M.D.   DATE OF BIRTH:  1922-02-13   DATE OF CONSULTATION:  05/30/2006  DATE OF DISCHARGE:                                 CONSULTATION   REASON FOR CONSULTATION:  We were asked to see patient, [name],  whose date of birth is November 15, 1922, in consult for abdominal pain  and diarrhea by Dr. Donnalee Curry.   HISTORY OF PRESENT ILLNESS:  This is an 77 year old female with a  history of small bowel obstruction 2 years ago having diarrhea,  abdominal pain and distention x3 weeks.  Her pain and diarrhea have  become intense since Sunday, 5 days ago.  She is positive for a previous  history of similar symptoms with her SBO 2 years ago, positive for  terrible pain in her lower abdomen that is now constant and eased with  pain medications, positive for nausea after meals which have been mostly  clear liquids, positive for diarrhea a short period after meals,  positive for black stools, positive for vomiting one time yesterday and  feels as though she is going to vomit again, positive for indigestion  and is currently on AcipHex.  The patient saw Dr. Theresia Lo approximately  3 weeks ago when she started feeling poorly and he started AcipHex.  The  patient reports that the prescription has helped until last Sunday when  her symptoms became severe.  She is also positive for NSAID exposure to  Aleve three to five times a week until 3 weeks ago when Dr. Theresia Lo  recommended that she stopped taking any NSAIDs.   PAST MEDICAL HISTORY:  Past medical history is significant for  rheumatoid arthritis for which she is treated by Dr. Coral Spikes, positive  for hypertension and arrhythmias (she is on Plavix), positive for  hysterectomy, positive for small bowel obstruction 2 years ago.  GI  history:  She has had two colonoscopies, one approximately 10 years ago  in [place] and one many years before that here in [place], she  reports that no polyps were found.  Her primary doctor in [place] is  Dr. Theresia Lo.   MEDICATIONS:  AcipHex, triamterene/hydrochlorothiazide, Crestor, Tylenol  No. 3, calcium titrate.   ALLERGIES:  TO SULFA.   REVIEW OF SYSTEMS:  Review of systems is significant for cough x3 weeks,  congestion, dry mouth, sore pharynx.   SOCIAL HISTORY:  No drugs.  No tobacco.  No alcohol.  She cares for her  husband who has Alzheimer's.  She has recently moved in with her son.   FAMILY HISTORY:  No GI cancers.   PHYSICAL EXAMINATION:  GENERAL:  She is alert and oriented, in no  apparent distress.  VITAL SIGNS:  Temperature is 98.1, pulse of 96, respirations 20, blood  pressure is 147/74.  CHEST:  Cardiovascular system has a regular rate and rhythm.  Lungs have  decreased breath sounds but no wheezes or  crackles appreciated.  ABDOMEN:  Has hyperactive bowel sounds.  It is soft, nontender,  nondistended.  RECTAL:  Positive for external and internal hemorrhoids.  No masses.  Guaiac negative.   LABORATORIES:  Labs show a hemoglobin 10.7, hematocrit 32.1, white count  3.9, platelets 138.  CMET shows potassium at 3.6, BUN 3, creatinine  0.93, glucose is 79.  Amylase and lipase are normal.  The patient had  lactoferrin positive, Clostridium difficile negative.  CT scan done on  May 26, 2006, showed a thick-walled segment of her ileum, free fluid in  her left abdomen and pelvis, positive for sigmoid diverticulosis.   CONSULTATIONS:  Surgery has consulted with the patient and has no  current recommendations for surgical procedures.   ASSESSMENT:  Dr. Molly Maduro Buccini has seen and examined the patient and  states she has multiple medical problems, possible small bowel  obstruction considering her current symptoms and CT result, also  possible gastritis versus  ulcer with her nonsteroidal anti-inflammatory  drug exposure with blood in her stool and upper tract symptoms.   PLAN:  We will go ahead and obtain an acute abdominal series and give  potassium, place the patient on sips and chips for now, evaluate for  endoscopic procedures in the near term. Thanks very much for this  consultation.      Stephani Police, PA    ______________________________  Bernette Redbird, M.D.    MLY/MEDQ  D:  05/30/2006  T:  05/30/2006  Job:  [id number]   cc:   Vikki Ports, M.D.  Kela Millin, M.D.

## 2010-05-29 NOTE — H&P (Signed)
[name], BOVEY                 ACCOUNT NO.:  [id number]   MEDICAL RECORD NO.:  [id number]          PATIENT TYPE:  INP   LOCATION:  1535                         [facility]ACILITY:  [facility]   PHYSICIAN:  Corinna L. Lendell Caprice, MDDATE OF BIRTH:  May 11, 1922   DATE OF ADMISSION:  10/16/2006  DATE OF DISCHARGE:                              HISTORY & PHYSICAL   CHIEF COMPLAINT:  Vomiting and diarrhea.   HISTORY OF PRESENT ILLNESS:  [name] is an 77 year old white female  who presents to the emergency room with a history of vomiting bilious  emesis since last night.  She has also had about six explosive green,  watery diarrhea.  She has some right-sided pain when she walks.  She had  a hospitalization several months ago for abdominal pain, vomiting and  diarrhea and had abnormal CAT scan at that time with thickened ileum.  She had been started on empiric antibiotics but specific cause of her  problem at that time was not found.  She reports that she has since had  outpatient upper endoscopy and colonoscopy by Dr. Matthias Hughs and apparently  it was significant for diverticulosis only.  She reports that her  symptoms are similar to her last hospitalization.  She has had no  hematemesis.  She has had no melena.  She had ice cream last night and  her husband and son have no similar symptoms.  She has a history of  rheumatoid arthritis and is on prednisone and methotrexate.  She has had  chills.  She is febrile here in the emergency room.  She has been unable  to keep anything down but feels a bit better after receiving antiemetic  in the emergency room.   PAST MEDICAL HISTORY:  As above.  Also, hypertension, hyperlipidemia.   MEDICATIONS:  1. Prednisone 5 mg a day.  2. Plavix 75 mg a day.  3. Restoril 15 mg nightly as needed.  4. Methotrexate 10 mg every Wednesday.  5. Toprol-XL 150 mg a day.  6. Amoxicillin prior to dental work.  7. Premarin 0.3 mg daily.  8. Crestor 10 mg daily.  9. Phenergan as  needed.  10.Hydrochlorothiazide.  11.Folic acid 3 mg daily.  12.Os-Cal daily.  13.Multivitamin a day.  14.Potassium 40 mEq daily.  15.Gaviscon and Maalox as needed.   SOCIAL HISTORY:  Reviewed and unchanged from previous H&P.   FAMILY HISTORY:  Noncontributory.   REVIEW OF SYSTEMS:  As above, otherwise negative.   PHYSICAL EXAMINATION:  Her temperature is initially 98.0 but on recheck  at 3:30 p.m. was 101.6, blood pressure 161/73, pulse 84, respiratory  rate 18, oxygen saturation 94% on room air.  GENERAL:  The patient is in no acute distress.  HEENT:  Normocephalic, atraumatic.  Pupils equal, round, reactive to  light.  Sclerae nonicteric.  Moist mucous membranes.  NECK:  Supple, no lymphadenopathy.  LUNGS:  Clear to auscultation bilaterally without wheezes, rhonchi or  rales.  CARDIOVASCULAR:  Regular rate and rhythm without murmurs, gallops or  rubs.  ABDOMEN:  Normal bowel sounds, soft, nontender, nondistended.  GU, RECTAL:  Deferred.  EXTREMITIES:  No clubbing, cyanosis or edema.  SKIN:  No rash.  PSYCHIATRIC:  Normal affect.  NEUROLOGIC:  Alert and oriented.  Cranial nerves and sensorimotor exam  are intact.   LABORATORIES:  CBC is unremarkable.  Complete metabolic panel  significant for a potassium of 3.2, otherwise unremarkable.  Lipase 24.  Urinalysis shows a specific gravity of 1.020, protein 30, negative  nitrite, negative leukocyte esterase, negative blood.  Preliminary  reading of CT of abdomen and pelvis shows slightly dilated bowel and  ascites, consider gastroenteritis, no obstruction, portal vein patent.   ASSESSMENT/PLAN:  1. Vomiting and diarrhea:  The patient has a history of ileitis in the      past, the cause of which was never clearly determined.  I will ask      for a GI consult to make any recommendations.  As she is febrile      and is immunosuppressed I will treat empirically with Cipro and      Flagyl.  I will check stool culture and C.  difficile.  She will get      supportive care.  She will be admitted to the floor.  She reports      that her symptoms are similar to previous hospitalization but she      is actually much less tender than she was when I cared for her back      in May.  This may not be a similar situation I will, however,      follow up the final CT scan reports.  2. Rheumatoid arthritis.  3. Hypertension.  4. Hyperlipidemia.  5. Hypokalemia.      Corinna L. Lendell Caprice, MD  Electronically Signed     CLS/MEDQ  D:  10/16/2006  T:  10/17/2006  Job:  [id number]   cc:   Vikki Ports, M.D.  Fax: [phone]   Bernette Redbird, M.D.  Fax: [phone]

## 2010-05-29 NOTE — Consult Note (Signed)
[name], [name]                 ACCOUNT NO.:  [id number]   MEDICAL RECORD NO.:  [id number]          PATIENT TYPE:  INP   LOCATION:  1535                         FACILITY:  [facility]   PHYSICIAN:  Shirley Friar, MDDATE OF BIRTH:  [birth year]   DATE OF CONSULTATION:  10/16/2006  DATE OF DISCHARGE:                                 CONSULTATION   REFERRING PHYSICIAN:  Corinna L. Lendell Caprice, MD   REASON FOR CONSULTATION:  We were asked to see the patient today by Dr.  Cammie Mcgee L. Lendell Caprice in consultation for nausea and vomiting.   HISTORY OF PRESENT ILLNESS:  This is an 77 year old female with a  history of small bowel obstruction x2 who presented to the [facility]  ER today after vomiting profusely since early this morning.  She also  experienced many watery diarrhea bowel movements that stopped at about 1  p.m. this afternoon.  She has experienced no hematemesis, melena or  hematochezia.  The patient has felt well since her hospitalization in  May until today.  She has had no sick contacts.  No travel.  She has  been using amoxicillin for just one day.  She takes it before she sees  the dentist and she started it because she has had a sore throat for  approximately one week.  She is also on chronic prednisone for  rheumatoid arthritis.   PAST MEDICAL HISTORY:  Past medical history is significant for  rheumatoid arthritis treated by Dr. Demetria Pore. Levitin, hypertension  treated by her primary care physician, Dr. Theresia Lo, dysphagia and small  bowel obstruction cared for by her gastroenterologist, Dr. Matthias Hughs,  arrhythmias, history of transient ischemic attack.  She has had a  hysterectomy.  She was noted to have a thickened ileum on CT in May of  2008 which resolved.  She had a colonoscopy in July of 2007 by Dr.  Matthias Hughs who found diverticulosis and a normal terminal ileum.  She also  had an endoscopy on July 27th by Dr. Matthias Hughs that was normal and her  biopsies from her endoscopy were  benign.   CURRENT MEDICATIONS:  Plavix, amlodipine, temazepam, prednisone,  methotrexate, metoprolol, amoxicillin, Premarin, Crestor and  promethazine.   ALLERGIES:  SULFA.   SOCIAL HISTORY:  No drugs, tobacco or alcohol.  Her husband has  Alzheimer's and they live with her son.   FAMILY HISTORY:  Family history is significant for no GI cancers in the  family.   PHYSICAL EXAMINATION:  She is alert, oriented, in no apparent distress,  very pleasant to speak with.  Her temperature is 101.6, her pulse was  84, her respirations are 18, her blood pressure is 161/73,  Her heart  rhythm is irregular.  Her lungs are clear to auscultation anteriorly.  Her abdomen is soft, nontender, nondistended.  I did not appreciate any  bowel sounds.  On CT today, her abdomen showed slightly dilated bowel  containing an increased amount of fluid and ascites.  The radiologist  questions gastroenteritis.  There was no sign of obstruction.  Mild AAA  portal vein was patent and  she had extensive arthrosclerosis.   LABORATORY DATA:  Labs today show a hemoglobin of 14.4, hematocrit 43.4,  white count 8.1, platelets 233.  Her potassium is 3.2, BUN 14,  creatinine 0.86, AST 25, ALT 9, alk phos 56, total bili 0.6, lipase is  24.   ASSESSMENT:  Dr. Charlott Rakes has seen and examined the patient and  collected a history.  His impression is that this is an 77 year old  female with multiple medical problems, currently with sudden onset of  vomiting and diarrhea as well as 101 degree temperature after one week  of sore throat.  Agree with the primary team's plan of gentle  rehydration, repleting potassium, sampling blood cultures, providing IV  Cipro and Flagyl.  Will plan to follow with you.  Thank you very much  for this consultation.      Stephani Police, [place]      Shirley Friar, MD  Electronically Signed    MLY/MEDQ  D:  10/16/2006  T:  10/17/2006  Job:  [id number]   cc:   Demetria Pore. Coral Spikes,  M.D.  Fax: [phone]   Vikki Ports, M.D.  Fax: [phone]   Bernette Redbird, M.D.  Fax: [phone]

## 2010-05-29 NOTE — H&P (Signed)
NAMEKEMANI, HEIDEL NO.:  [id number]   MEDICAL RECORD NO.:  [id number]          PATIENT TYPE:  EMS   LOCATION:  ED                           FACILITY:  [facility]   PHYSICIAN:  Kela Millin, M.D.DATE OF BIRTH:  1922-07-13   DATE OF ADMISSION:  05/26/2006  DATE OF DISCHARGE:                              HISTORY & PHYSICAL   PRIMARY CARE PHYSICIAN:  Vikki Ports, M.D.   CHIEF COMPLAINT:  Abdominal pain.   HISTORY OF PRESENT ILLNESS:  The patient is an 77 year old white female  with past medical history significant for irregular heart beat on no  Coumadin, hypertension, rheumatoid arthritis, history of small-bowel  obstruction 2 years ago, who presents with complaints of abdominal pain  x1 day.  The patient states that a few weeks ago she saw her primary  care physician with some abdominal complaints and at that time she was  started on AcipHex.  Since then, she reports that she did well until the  night prior to admission when she began having abdominal pain, described  as upper abdominal in her supraumbilical area and also a bit over in her  left upper quadrant.  She states that she did not have any diarrhea  until she came to the emergency room and after she drank some contrast  she began having diarrhea and had six episodes in the ER.  She denies  melena and no hematochezia.  She also denies fevers, dysuria, chest  pain, cough, nausea, vomiting and no hematemesis.  She describes the  pain as severe, 10/10 in intensity at its worst.   The patient was seen in the ER and a CT scan of her abdomen revealed a  thick-walled distended ileum with free fluid in the pelvis and left  abdomen, possible bowel ischemia; infection also possible per  radiologist.  Her urinalysis was unremarkable and her white cell count  was within normal limits.  She was noted to have electrolyte  abnormalities and she is admitted to the [facility] service for  further  evaluation and management.  The patient states that she was on  an antibiotic for an ear infection a couple of weeks ago and developed  some diarrhea with that, but that resolved.   PAST MEDICAL HISTORY:  As stated above.   MEDICATIONS:  1. Methotrexate 2.5 mg four tablets each Wednesday.  2. Folic acid 400 mcg daily.  3. Toprol-XL 150 mg daily.  4. Multivitamin one daily.  5. KCl 10 mEq b.i.d.  6. Premarin 0.3 mg daily.  7. Restoril 15 mg q.h.s.  8. AcipHex 20 mg p.o. daily.  9. Calcium citrate 500 mg daily.  10.Crestor 10 mg daily.  11.Maxzide 37.5/25 mg daily.  12.Plavix 75 mg daily.  13.Tylenol No. 3 with codeine p.r.n.   ALLERGIES:  SULFA.   SOCIAL HISTORY:  She quit tobacco in 1970.  She denies alcohol use.   FAMILY HISTORY:  Negative for MI.  Also negative for diabetes.  Her  brother had prostate cancer.   REVIEW OF SYSTEMS:  As per HPI.  Other review  of systems negative.   PHYSICAL EXAMINATION:  GENERAL:  The patient is a pleasant, elderly  white female, obese, in no apparent distress.  VITAL SIGNS:  Her blood pressure is 136/59, initially 180/84.  Her pulse  77, initially 69.  Respiratory rate is 20, O2 saturation is 93%.  HEENT:  PERRL, EOMI.  Slightly dry mucous membranes.  Sclerae anicteric,  no oral exudates.  NECK:  Supple, no adenopathy, no JVD and no carotid bruits appreciated.  LUNGS:  Clear to auscultation bilaterally.  No crackles or wheezes.  CARDIOVASCULAR:  Irregular, rate controlled.  No S3 appreciated.  ABDOMEN:  Soft, bowel sounds present.  Mild supraumbilical tenderness  and also tenderness present in the left upper quadrant.  No rebound  tenderness, nondistended, no organomegaly and no masses palpable.  EXTREMITIES:  Diminished posterior tibial pulses.  No cyanosis, no  edema.  Multiple bluish varicose veins present.  NEUROLOGIC:  Alert and oriented x3.  Cranial nerves II-XII grossly  intact.  Nonfocal exam.   LABORATORY DATA:  CT scan of  abdomen and pelvis as above.  Chest x-ray  negative for acute findings.  Urinalysis unremarkable.  Her white cell  count is 7.6 with a hemoglobin of 11.5, hematocrit 35.1, platelet count  152, neutrophil count 72%.  Sodium is 140 with a potassium of 3.2,  chloride 107, CO2 is 26 with a glucose of 90, BUN of 12, creatinine  0.79, calcium 8.4, albumin is 2.8, total protein 5.5, AST 26, ALT 11.  INR is 1.0.   ASSESSMENT AND PLAN:  1. Abdominal pain.  As discussed above, CT scan with possible bowel      ischemia, although infection also possible.  Will place the patient      on empiric antibiotics, pain management.  Will consult surgery for      further recommendations.  Will obtain stool studies, also serum      amylase and lipase.  As noted above, urinalysis negative for      infection.  2. Irregular heart beat.  Obtain an EKG.  Monitor on telemetry.      Continue metoprolol.  Rate is controlled.  Will follow and continue      Plavix as well.  3. Hypokalemia.  Replace potassium.  4. Hypertension.  Continue metoprolol.  5. Rheumatoid arthritis.  Continue methotrexate.  LFTs noted to be      within normal limits as above.  The patient followed by Dr.      Coral Spikes.      Kela Millin, M.D.  Electronically Signed     ACV/MEDQ  D:  05/26/2006  T:  05/26/2006  Job:  [id number]   cc:   Vikki Ports, M.D.  Fax: [phone]

## 2010-05-29 NOTE — Discharge Summary (Signed)
[name], [name]                 ACCOUNT NO.:  [id number]   MEDICAL RECORD NO.:  [id number]          PATIENT TYPE:  INP   LOCATION:  5528                         FACILITY:  [facility]   PHYSICIAN:  [name], MDDATE OF BIRTH:  1922-07-10   DATE OF ADMISSION:  05/26/2006  DATE OF DISCHARGE:  06/02/2006                               DISCHARGE SUMMARY   DISCHARGE DIAGNOSES:  1. Abdominal pain.  2. Diarrhea.  3. Nausea and vomiting.  4. Abnormal CT of the abdomen showing thickened ileum, which does not      extend all the way to the cecum.  Repeat CAT scan within normal      limits.  5. Acute sinusitis.  6. Hypertension.  7. Hypokalemia.  8. Rheumatoid arthritis.  9. Thrush.   DISCHARGE MEDICATIONS:  1. Percocet 5/325 one tablet every 4 hours as needed for pain.  2. Afrin 2 sprays per nostril twice daily for 3 to 5 days, then stop.  3. Nystatin suspension 5 cc swish and swallow 3 times a day for 3 to 5      days.   She may continue her other patient medications, which include:  1. Prednisone 5 mg a day.  2. Remicade.  3. Os-Cal.  4. MiraLax as needed.  5. Imodium as needed.  6. Folic acid 400 mcg t.i.d.  7. Methotrexate every Wednesday.  8. Toprol-XL 100 mg a day.  9. Multivitamin.  10.KCl 10 mEq a day.  11.Temazepam 15 mg p.o. q.h.s. p.r.n. sleep.  12.AcipHex 20 mg a day.  13.Calcium citrate 500 mg a day.  14.Crestor 10 mg a day.  15.Triamterine/hydrochlorothiazide 37.5/25 mg a day.   DIET:  As tolerated.   ACTIVITY:  No driving while on pain medication.  Otherwise, ad-lib.   FOLLOWUP:  With Dr. Theresia Jones and/or Dr. Matthias Jones as needed.   CONSULTATIONS:  Dr. Matthias Jones.   PROCEDURE:  None.   CONDITION ON DISCHARGE:  Stable.   PERTINENT LABORATORIES:  A stool culture negative.  CBC significant for  a hemoglobin of 10.8, hematocrit 32.6; otherwise unremarkable.  Basic  metabolic panel unremarkable.  Liver function test significant for an  albumin of 2.7;  otherwise unremarkable.  A stool for C. diff toxin  negative.  Fecal lactoferrin positive.   SPECIAL STUDIES AND RADIOLOGY:  Chest x-ray on admission showed nothing  active.   CT of the abdomen and pelvis with contrast showed normal liver, spleen,  pancreas, kidneys with a thickened loop of ileum in the right mid  abdomen with mild stranding of the adjacent mesenteric fat.  No obvious  thrombosis of the SMA.  No abscess.  Concerning for small bowel ischemia  versus infection or hemorrhage.  A small to moderate amount of free  fluid in the pelvis.  Sigmoid diverticulosis.   Acute abdominal series on May 16 showed bibasilar atelectasis and new  small effusions.  No obstruction, ileus or free air.   Pelvic x-ray showed sacral ileac and lower lumbar spine degenerative  changes.   Repeat CAT scan on May 18 showed resolution of the thickened ilium.  This is the preliminary report.  The official report is pending.   EKG showed normal sinus rhythm with occasional PVCs.   HISTORY AND HOSPITAL COURSE:  [name] is a pleasant, 77 year old,  white female patient of Dr. Theresia Jones, who presented with abdominal pain.  Please see H&P for full details.  She initially had periumbilical pain,  and then right upper quadrant.  She had diarrhea in the emergency room,  and a few more episodes in the hospital, but this seemed to follow  barium ingestion.  She had some nausea while here.  Her appetite  initially was quite poor, but at the time of discharge was about her  baseline.  She was afebrile with a blood pressure of 136/59, normal  pulse and respiratory rate.  She had some periumbilical tenderness in  the left upper quadrant, but no rebound tenderness or distention.  Dr.  Suanne Jones discussed the case with Dr. Simona Jones of the surgical service, and it  was not felt that this was ischemic bowel.  GI consulted the cause of  her abdominal pain and abnormal CAT scan is not clear.  She initially  was started on  empiric antibiotics, but had been off antibiotics for  several days at the time of discharge without recurrence of her symptoms  or fever, nor did she have a white count.  Her abdominal pain and  appetite, as well as nausea did improve while hospitalized, and at this  time she is safe for discharge.   She also had multiple other complaints during her hospitalization, one  of which was pain around her right sacral iliac area.  X-ray did in fact  show degenerative changes there.  She also complained of cough and sinus  congestion and headache.  This improved with Afrin nasal spray and cough  suppressant.  X-ray was negative for a pneumonia.  She also developed thrush while here, which responded well to nystatin.  She was hypokalemic on admission, and this was repleted adequately.   Total time on the day of discharge 45 minutes.      [name] L. Lendell Caprice, MD  Electronically Signed     CLS/MEDQ  D:  06/02/2006  T:  06/02/2006  Job:  [id number]   cc:   [name], M.D.  [name], M.D.

## 2010-07-13 NOTE — Group Therapy Note (Signed)
NAME:  [name], [name] NO.:  [id number]  MEDICAL RECORD NO.:  [id number]           PATIENT TYPE:  E  LOCATION:  WLED                         FACILITY:  [facility]  PHYSICIAN:  Ramiro Harvest, MD    DATE OF BIRTH:  1922/03/29                                PROGRESS NOTE   PROGRESS NOTE COVERING THE EVENTS FROM March 14, 2010, THROUGH March 18, 2010:  PRIMARY CARE PHYSICIAN: Beverley Fiedler, MD.  GASTROENTEROLOGIST: Bernette Redbird, MD.  CURRENT DIAGNOSES: 1. Acute sigmoid diverticulitis, improving. 2. Dysphagia secondary to esophageal stricture and severe esophageal     dysmotility, status post EGD with Botox. 3. Hypokalemia, repleted. 4. 3.8 cm complex mixed solid and cystic thyroid mass. 5. Acute renal failure, improved. 6. Anemia of chronic disease/iron-deficiency anemia. 7. Malnutrition secondary to problem #2. 8. History of rheumatoid arthritis. 9. Hypertension. 10.History of atrial fibrillation, not a Coumadin candidate. 11.Coronary artery disease, status post myocardial infarction. 12.History of sinusitis. 13.Low back pain. 14.Hyperlipidemia. 15.Vertigo. 16.Status post hysterectomy. 17.Status post cataract surgery. 18.Status post wrist surgery. 19.Status post foot surgery.  CURRENT MEDICATIONS: 1. Ciprofloxacin 400 mg IV b.i.d. 2. Plavix 75 mg p.o. daily. 3. Ensure 237 mL p.o. t.i.d. 4. Plaquenil 200 mg p.o. b.i.d. 5. Sliding scale insulin. 6. Meclizine 25 mg p.o. t.i.d. 7. Metoprolol 100 mg p.o. b.i.d. 8. Flagyl 500 mg IV q.8. 9. Florastor 1 tablet p.o. b.i.d. 10.Protonix 40 mg p.o. b.i.d. 11.K-Dur 10 mEq p.o. daily. 12.Prednisone 7.5 mg p.o. daily. 13.Crestor 10 mg p.o. daily.  DISPOSITION AND FOLLOWUP: Will be dictated per discharging physician.  CONSULTATIONS DONE DURING THIS PERIOD: GI consultation was done.  The patient was seen in consultation by Dr. Matthias Hughs on March 15, 2010.  PROCEDURES PERFORMED: 1. Acute abdominal  series was done March 13, 2010, that showed no     active cardiopulmonary disease.  No acute intra-abdominal     pathology. 2. CT of the abdomen and pelvis done March 13, 2010, showed no     evidence of esophageal injury or perforation.  Findings most     consistent with early acute sigmoid diverticulitis, ischemia is     less likely given the distribution.  Lumbar spinal stenosis. 3. CT of the chest without contrast was done on March 13, 2010,     that showed a large thyroid nodule.  Ultrasound is recommended.  No     acute intrathoracic evidence of esophageal injury, no acute     cardiopulmonary disease. 4. A barium swallow with tablet was done on March 15, 2010, that showed     no penetration or aspiration, severe dysmotility, significant     distal esophageal stricture. 5. A thyroid ultrasound was done on March 16, 2010, that showed a 3.8     cm complex, mixed, solid and cystic mass inferior to both thyroid     lobes corresponding to the mass seen on chest CT.  This is most     compatible with a dominant thyroid nodule, possibly arising from     the right thyroid lobe although difficult to tell.  Single internal  calcification is noted.  Tissue sampling could be performed if felt     clinically indicated. 6. An upper endoscopy with a Botox injection was done per Dr. Dorena Cookey of [facility] on March 16, 2010.  BRIEF ADMISSION HISTORY: [name] is an 77 year old female with a history of rheumatoid arthritis, esophageal stricture in the past, who was apparently seen on the day of admission, per her gastroenterologist Dr. Matthias Hughs.  She underwent upper endoscopy with balloon and Savary dilatation of the esophagus.  When the patient arrived home, she apparently had a bout of nausea, vomiting, abdominal pain and called 911.  The patient was brought to the emergency room for evaluation.  CT scan of the abdomen and pelvis showed findings most consistent  with early acute sigmoid diverticulitis, ischemia was less likely given the distribution.  There was no evidence of esophageal injury or perforation.  As noted, there was a large thyroid nodule and ultrasound was recommended.  The patient was admitted for diverticulitis.  For the rest of admission history and physical, please see H and P dictated per Dr. Selena Batten; job number [id number].  HOSPITAL COURSE: 1. Acute sigmoid diverticulitis:  The patient was admitted with acute     sigmoid diverticulitis.  She was placed on IV Cipro as well as IV     Flagyl.  The patient was initially placed on a clear liquid diet.     The patient was monitored.  The patient's diet was advanced to a     full liquid diet.  However, the patient was later complaining of     dysphagia.  The patient was subsequently maintained on this diet.     She was maintained on IV Cipro IV Flagyl.  The patient remained     afebrile.  She improved clinically during this hospitalization, and     during this time, her abdominal pain improved and the patient did     have a normal white count.  The patient remained in a stable     condition and is currently in stable and improved condition as of     March 18, 2010.  Due to the patient's history of severe esophageal     dysmotility and esophageal stricture, she will be maintained on IV     antibiotics until the day of discharge where she may be     transitioned to oral antibiotics to complete her course of     antibiotics for acute sigmoid diverticulitis.  The patient's diet     has been subsequently advanced and she is tolerating a mechanical,     soft low-residue diet.  The patient is currently in a stable     condition. 2. Dysphagia secondary to esophageal stricture and severe esophageal     dysmotility:  During the hospitalization, the patient did have some     complaints of dysphagia especially with the tablets and with her     meal, and as such her diet had to be changed to full  liquid diet     from a mechanical, soft as the patient continued to have problems     with dysphagia.  A barium swallow with a tablet was done, with     results which have been stated above, which did show severe     dysmotility and significant distal esophageal stricture.  A GI     consultation was subsequently obtained.  The patient was seen in  consultation by Dr. Matthias Hughs on March 15, 2010, and subsequently     followed up upon per Dr. Madilyn Fireman of [facility] who     performed an upper EGD with Botox injections on 03/16/2010.  The     patient was subsequently kept on a full liquid diet, which was     subsequently advanced to a mechanical, soft low-residue diet which     the patient now tolerated well.  The patient will need to follow up     with GI as an outpatient.  The patient is currently in a stable     condition. 3. Hypokalemia:  The patient was noted to be hypokalemic during the     hospitalization.  Her potassium has been repleted and this will     need to be followed up upon as an outpatient. 4. A 3.8 cm complex, mixed, solid and cystic thyroid mass:  On     admission chest CT which was done did note a thyroid mass and as     such a TSH was obtained.  TSH came within normal limits at 2.925.     A thyroid ultrasound was done with results as stated above, whichdid reveal a 3.8 cm complex, mixed, solid and cystic mass which was     inferior to both thyroid lobes.  The patient has remained     asymptomatic during this time.  Interventional radiology will be     consulted for a FNA of this thyroid mass to be done.  Once FNA is     done and if the patient is in stable condition, the patient may be     discharged with an outpatient followup with her PCP for follow up     on her FNA results and further evaluation of this thyroid mass. 5. Acute renal failure:  On admission, the patient was noted to be in     acute renal failure with a creatinine up as high as 1.87.  It was      felt this was secondary to a prerenal azotemia.  The patient was     placed on gentle IV fluid hydration with improvement in her renal     function, such that by March 18, 2010, the patient's creatinine was     down to 1.07.  The patient's fluids have been discontinued and the     patient is currently in a stable condition and will need to follow     up as an outpatient with her PCP. 6. Anemia of chronic disease/iron-deficiency anemia:  The patient was     noted to be anemic on admission.  An anemia panel was obtained,     which was consistent with anemia of chronic disease.  The patient's     hemoglobin has remained stable.  Prior to admission, the patient     did have an upper EGD with dilatation done and it was with some     mucosal irritation as well and as such a FOBT was not checked as it     was felt this will be inaccurate.  The patient will need to follow     up with her gastroenterologist as an outpatient for her anemia. 7. Malnutrition:  The patient was noted to have malnutrition with a     decreased BMI of 18.5 and albumin of 2.4, with a total protein of     4.8.  It was felt that the patient's malnutrition was secondary to  a dysphagia, secondary to esophageal stricture and severe     esophageal dysmotility.  The patient did have a Botox injection     with upper EGD done.  She has been placed on Ensure three times     daily, and  will need to follow up as an outpatient. 8. The rest of the patient's chronic medical issues have remained     stable.  It has been a pleasure taking care of Ms. [name].     Ramiro Harvest, MD     DT/MEDQ  D:  03/18/2010  T:  03/18/2010  Job:  [id number]  cc:   Fanny Dance. Rankins, M.D. Fax: [phone]  Bernette Redbird, M.D. Fax: [phone]  Electronically Signed by Ramiro Harvest MD on 04/06/2010 12:23:22 PM

## 2010-08-20 ENCOUNTER — Encounter (HOSPITAL_BASED_OUTPATIENT_CLINIC_OR_DEPARTMENT_OTHER): Payer: Medicare Other | Attending: Internal Medicine

## 2010-08-20 DIAGNOSIS — I10 Essential (primary) hypertension: Secondary | ICD-10-CM | POA: Insufficient documentation

## 2010-08-20 DIAGNOSIS — IMO0002 Reserved for concepts with insufficient information to code with codable children: Secondary | ICD-10-CM | POA: Insufficient documentation

## 2010-08-20 DIAGNOSIS — L97809 Non-pressure chronic ulcer of other part of unspecified lower leg with unspecified severity: Secondary | ICD-10-CM | POA: Insufficient documentation

## 2010-08-20 DIAGNOSIS — Z79899 Other long term (current) drug therapy: Secondary | ICD-10-CM | POA: Insufficient documentation

## 2010-08-21 NOTE — Progress Notes (Unsigned)
[facility]  NAME:  [name], [name] NO.:  [id number]  MEDICAL RECORD NO.:  [id number]      DATE OF BIRTH:  [birth year]  PHYSICIAN:  Jonelle Sports. Marna Weniger, M.D.  VISIT DATE:  08/20/2010                                  OFFICE VISIT   HISTORY:  This 77 year old white female resident of [facility] is seen for evaluation of nonhealing ulcerations on the pretibial aspects of both legs.  The patient has a very complex medical history related to rheumatoid arthritis which have been present for most of her adult life.  She has been on prednisone frequently throughout the years and is currently on a dose of 7.5 mg per day along with chloroquine.  She is very immobile and spends most of her time on wheelchair which leaves her with a lot of dependent edema.  She also has chronic venous insufficiency with a severe stasis dermatitis in both lower extremities.  With that combination of risk factors, the patient apparently struck her pretibial areas while moving from wheelchair to the table and sustained a traumatic ulcerations there.  This occurred some 3-4 months ago and they have not healed despite usual treatments provided at the [facility].  Apparently, most recently they have used Santyl on the ulcer on the right lower extremity and has resulted as cleaned up the patient says nicely.  She is here today for our advice regarding any further available treatments.  PAST MEDICAL HISTORY:  Notable for esophageal dilatations, medical hospitalizations for pneumonia and for her rheumatoid on at least one occasion.  She has allergies to AMBIEN, CIPRO, and SULFA.  Her regular medications include: 1. Metoprolol 100 mg b.i.d. 2. Pro-Stat 101 30 mL b.i.d. 3. Medpass 2 ounces b.i.d. 4. Vitamin C 500 mg b.i.d. 5. Senokot 2 tablets at bedtime. 6. Symbyax 6/25 mg 1 at bedtime. 7. Plavix 75 mg daily. 8. Lasix 40 mg Monday, Wednesday, and  Friday. 9. Multiple vitamin daily. 10.Lisinopril 10 mg daily. 11.Hydroxychloroquine 400 mg daily.  FAMILY HISTORY:  Noted in detail, but apparently is positive for rheumatoid arthritis.  PERSONAL HISTORY:  The patient is married, retired, lives in the [facility] where she is totally cared for. She denies smoking, alcohol use, or use of recreational drugs.  REVIEW OF SYSTEMS:  The patient has no history of diabetes, cancer, radiation treatment or of chemotherapy.  She has no known renal disease and no history of dialysis.  She is hypertensive.  She has known problems with venous circulation in her lower extremities.  She has some trouble with dysphagia and reflux as previously indicated.  She also has some bladder incontinence.  In addition, several further medications have emerged that includes: 1. Folic acid 0.4 mg t.i.d. 2. Potassium chloride 10 mEq daily. 3. Her prednisone which is 7.5 mg daily. 4. Iron 325 mg daily. 5. Zinc sulfate 220 mg daily. 6. Lasix 20 mg on Tuesday, Thursday, Saturday, and Sunday, in addition     to her 40 was taken daily. 7. Zofran 4 mg every 4 hours as needed for nausea. 8. Tylenol EX 500 mg 1-2 tablets every 6 hours as needed. 9. Vicodin 5/325 one every 6 hours p.r.n.  PHYSICAL  EXAMINATION:  VITAL SIGNS:  Blood pressure is 130/72, pulse 80, respirations 18, temperature 98.6. GENERAL:  This is a frail, elderly lady pleasant, alert and cooperative, who has severe deformities consistent with rheumatoid arthritis.  She has cushingoid features.  Her skin is extremely thin. THROAT:  Mucous membranes are pink and moist. CHEST:  Shows dry crackles at both lung bases. HEART:  Shows some irregularity of rhythm with fourth heart sound as well. ABDOMEN:  Protuberant, but nontender and without obvious organomegaly or mass. EXTREMITIES:  Show significant 3+ pitting edema to the knees bilaterally.  Her pulses in her feet can  be felt through this and on handheld Doppler determination, her pulses an her feet are biphasic at both the dorsalis pedis and posterior tibial areas in both lower extremities. On her distal right pretibial areas, large ulcer measuring 11.0 x 7.0 x 0.1 cm which has a somewhat granular base and is reasonably clean. There is a more superficial ulcer on the left pretibial area measuring 2.0 x 1.0 x 0.1 cm.  IMPRESSION:  Traumatically induced stasis ulcerations, bilateral lower extremities.  DISPOSITION: 1. The wounds today do not require debridement as they are indeed     relatively clean. 2. They are treated with an application of silver collagen bilaterally     and both lower extremities are placed in Profore Lite wraps which     are deemed safe based on the findings on arterial Dopplers.  The receiving facility is recommended to leave these in place until the patient is seen here a week, hence to reinforce them if necessary, but also to call us if they should slip down.  We have asked the facility to share with [place] any lab work they have and also do a prealbumin level if such is not previously been done.  Prior to dressing the wound, the right lower extremity was cultured.  Return visit here will be in 1 week.          ______________________________ Jonelle Sports. Cheryll Cockayne, M.D.     RES/MEDQ  D:  08/20/2010  T:  08/21/2010  Job:  [id number]

## 2010-09-18 ENCOUNTER — Encounter (HOSPITAL_BASED_OUTPATIENT_CLINIC_OR_DEPARTMENT_OTHER): Payer: Medicare Other | Attending: Internal Medicine

## 2010-09-18 DIAGNOSIS — L97809 Non-pressure chronic ulcer of other part of unspecified lower leg with unspecified severity: Secondary | ICD-10-CM | POA: Insufficient documentation

## 2010-10-15 ENCOUNTER — Encounter (HOSPITAL_BASED_OUTPATIENT_CLINIC_OR_DEPARTMENT_OTHER): Payer: Medicare Other | Attending: Internal Medicine

## 2010-10-15 DIAGNOSIS — L97809 Non-pressure chronic ulcer of other part of unspecified lower leg with unspecified severity: Secondary | ICD-10-CM | POA: Insufficient documentation

## 2010-10-25 LAB — CLOSTRIDIUM DIFFICILE EIA: C difficile Toxins A+B, EIA: NEGATIVE

## 2010-10-25 LAB — STOOL CULTURE

## 2010-10-25 LAB — DIFFERENTIAL
Lymphocytes Relative: 15
Lymphs Abs: 1.2
Monocytes Relative: 4
Neutro Abs: 6.4
Neutrophils Relative %: 80 — ABNORMAL HIGH

## 2010-10-25 LAB — URINALYSIS, ROUTINE W REFLEX MICROSCOPIC
Bilirubin Urine: NEGATIVE
Glucose, UA: NEGATIVE
Ketones, ur: NEGATIVE
Leukocytes, UA: NEGATIVE
pH: 8

## 2010-10-25 LAB — CBC
Hemoglobin: 14.4
MCHC: 33.2
MCV: 97.8
RDW: 17.4 — ABNORMAL HIGH

## 2010-10-25 LAB — BASIC METABOLIC PANEL
CO2: 27
GFR calc Af Amer: >60
GFR calc non Af Amer: 56 — ABNORMAL LOW
Glucose, Bld: 95
Potassium: 3.9
Sodium: 141

## 2010-10-25 LAB — URINE CULTURE: Culture: NO GROWTH

## 2010-10-25 LAB — CULTURE, BLOOD (ROUTINE X 2): Culture: NO GROWTH

## 2010-10-25 LAB — URINE MICROSCOPIC-ADD ON

## 2010-10-25 LAB — COMPREHENSIVE METABOLIC PANEL
ALT: 9
BUN: 14
Calcium: 9.4
Creatinine, Ser: 0.86
Glucose, Bld: 106 — ABNORMAL HIGH
Sodium: 138
Total Protein: 6.8

## 2010-10-25 LAB — FECAL LACTOFERRIN, QUANT

## 2010-10-25 LAB — OCCULT BLOOD X 1 CARD TO LAB, STOOL: Fecal Occult Bld: POSITIVE

## 2010-10-25 LAB — LIPASE, BLOOD: Lipase: 24

## 2010-11-19 ENCOUNTER — Encounter (HOSPITAL_BASED_OUTPATIENT_CLINIC_OR_DEPARTMENT_OTHER): Payer: Medicare Other | Attending: Internal Medicine

## 2010-11-19 DIAGNOSIS — I10 Essential (primary) hypertension: Secondary | ICD-10-CM | POA: Insufficient documentation

## 2010-11-19 DIAGNOSIS — I872 Venous insufficiency (chronic) (peripheral): Secondary | ICD-10-CM | POA: Insufficient documentation

## 2010-11-19 DIAGNOSIS — Z7902 Long term (current) use of antithrombotics/antiplatelets: Secondary | ICD-10-CM | POA: Insufficient documentation

## 2010-11-19 DIAGNOSIS — L97809 Non-pressure chronic ulcer of other part of unspecified lower leg with unspecified severity: Secondary | ICD-10-CM | POA: Insufficient documentation

## 2010-11-19 DIAGNOSIS — Z8614 Personal history of Methicillin resistant Staphylococcus aureus infection: Secondary | ICD-10-CM | POA: Insufficient documentation

## 2010-11-19 DIAGNOSIS — Z79899 Other long term (current) drug therapy: Secondary | ICD-10-CM | POA: Insufficient documentation

## 2010-12-17 ENCOUNTER — Encounter (HOSPITAL_BASED_OUTPATIENT_CLINIC_OR_DEPARTMENT_OTHER): Payer: Medicare Other

## 2010-12-27 ENCOUNTER — Encounter (HOSPITAL_BASED_OUTPATIENT_CLINIC_OR_DEPARTMENT_OTHER): Payer: Medicare Other | Attending: Internal Medicine

## 2010-12-27 DIAGNOSIS — D649 Anemia, unspecified: Secondary | ICD-10-CM | POA: Insufficient documentation

## 2010-12-27 DIAGNOSIS — Z79899 Other long term (current) drug therapy: Secondary | ICD-10-CM | POA: Insufficient documentation

## 2010-12-27 DIAGNOSIS — I872 Venous insufficiency (chronic) (peripheral): Secondary | ICD-10-CM | POA: Insufficient documentation

## 2010-12-27 DIAGNOSIS — L97809 Non-pressure chronic ulcer of other part of unspecified lower leg with unspecified severity: Secondary | ICD-10-CM | POA: Insufficient documentation

## 2010-12-27 DIAGNOSIS — I10 Essential (primary) hypertension: Secondary | ICD-10-CM | POA: Insufficient documentation

## 2011-01-24 ENCOUNTER — Encounter (HOSPITAL_BASED_OUTPATIENT_CLINIC_OR_DEPARTMENT_OTHER): Payer: Medicare Other

## 2011-01-31 ENCOUNTER — Encounter (HOSPITAL_BASED_OUTPATIENT_CLINIC_OR_DEPARTMENT_OTHER): Payer: Medicare Other | Attending: Internal Medicine

## 2011-01-31 DIAGNOSIS — I10 Essential (primary) hypertension: Secondary | ICD-10-CM | POA: Insufficient documentation

## 2011-01-31 DIAGNOSIS — L97809 Non-pressure chronic ulcer of other part of unspecified lower leg with unspecified severity: Secondary | ICD-10-CM | POA: Insufficient documentation

## 2011-01-31 DIAGNOSIS — I872 Venous insufficiency (chronic) (peripheral): Secondary | ICD-10-CM | POA: Insufficient documentation

## 2011-01-31 DIAGNOSIS — Z79899 Other long term (current) drug therapy: Secondary | ICD-10-CM | POA: Insufficient documentation

## 2011-02-28 ENCOUNTER — Encounter (HOSPITAL_BASED_OUTPATIENT_CLINIC_OR_DEPARTMENT_OTHER): Payer: Medicare Other

## 2011-03-07 ENCOUNTER — Encounter (HOSPITAL_BASED_OUTPATIENT_CLINIC_OR_DEPARTMENT_OTHER): Payer: Medicare Other | Attending: Internal Medicine

## 2011-03-07 DIAGNOSIS — I872 Venous insufficiency (chronic) (peripheral): Secondary | ICD-10-CM | POA: Insufficient documentation

## 2011-03-07 DIAGNOSIS — L97809 Non-pressure chronic ulcer of other part of unspecified lower leg with unspecified severity: Secondary | ICD-10-CM | POA: Insufficient documentation

## 2011-03-21 ENCOUNTER — Encounter (HOSPITAL_BASED_OUTPATIENT_CLINIC_OR_DEPARTMENT_OTHER): Payer: Medicare Other | Attending: Internal Medicine

## 2011-03-21 DIAGNOSIS — I872 Venous insufficiency (chronic) (peripheral): Secondary | ICD-10-CM | POA: Insufficient documentation

## 2011-03-21 DIAGNOSIS — L97809 Non-pressure chronic ulcer of other part of unspecified lower leg with unspecified severity: Secondary | ICD-10-CM | POA: Insufficient documentation

## 2011-04-18 ENCOUNTER — Encounter (HOSPITAL_BASED_OUTPATIENT_CLINIC_OR_DEPARTMENT_OTHER): Payer: Medicare Other | Attending: Internal Medicine

## 2011-04-18 DIAGNOSIS — L97809 Non-pressure chronic ulcer of other part of unspecified lower leg with unspecified severity: Secondary | ICD-10-CM | POA: Insufficient documentation

## 2011-04-18 DIAGNOSIS — I872 Venous insufficiency (chronic) (peripheral): Secondary | ICD-10-CM | POA: Insufficient documentation

## 2011-05-16 ENCOUNTER — Encounter (HOSPITAL_BASED_OUTPATIENT_CLINIC_OR_DEPARTMENT_OTHER): Payer: Medicare Other | Attending: Internal Medicine

## 2011-05-16 DIAGNOSIS — I872 Venous insufficiency (chronic) (peripheral): Secondary | ICD-10-CM | POA: Insufficient documentation

## 2011-05-16 DIAGNOSIS — Z7902 Long term (current) use of antithrombotics/antiplatelets: Secondary | ICD-10-CM | POA: Insufficient documentation

## 2011-05-16 DIAGNOSIS — L97509 Non-pressure chronic ulcer of other part of unspecified foot with unspecified severity: Secondary | ICD-10-CM | POA: Insufficient documentation

## 2011-05-16 DIAGNOSIS — I10 Essential (primary) hypertension: Secondary | ICD-10-CM | POA: Insufficient documentation

## 2011-05-16 DIAGNOSIS — L97809 Non-pressure chronic ulcer of other part of unspecified lower leg with unspecified severity: Secondary | ICD-10-CM | POA: Insufficient documentation

## 2011-05-16 DIAGNOSIS — Z79899 Other long term (current) drug therapy: Secondary | ICD-10-CM | POA: Insufficient documentation

## 2011-06-20 ENCOUNTER — Encounter (HOSPITAL_BASED_OUTPATIENT_CLINIC_OR_DEPARTMENT_OTHER): Payer: Medicare Other | Attending: Internal Medicine

## 2011-06-20 DIAGNOSIS — I872 Venous insufficiency (chronic) (peripheral): Secondary | ICD-10-CM | POA: Insufficient documentation

## 2011-06-20 DIAGNOSIS — L97809 Non-pressure chronic ulcer of other part of unspecified lower leg with unspecified severity: Secondary | ICD-10-CM | POA: Insufficient documentation

## 2011-06-20 DIAGNOSIS — Z79899 Other long term (current) drug therapy: Secondary | ICD-10-CM | POA: Insufficient documentation

## 2011-06-20 DIAGNOSIS — L97509 Non-pressure chronic ulcer of other part of unspecified foot with unspecified severity: Secondary | ICD-10-CM | POA: Insufficient documentation

## 2011-06-20 DIAGNOSIS — Z7902 Long term (current) use of antithrombotics/antiplatelets: Secondary | ICD-10-CM | POA: Insufficient documentation

## 2011-06-20 DIAGNOSIS — I10 Essential (primary) hypertension: Secondary | ICD-10-CM | POA: Insufficient documentation

## 2011-07-04 ENCOUNTER — Encounter (HOSPITAL_BASED_OUTPATIENT_CLINIC_OR_DEPARTMENT_OTHER): Payer: Medicare Other | Attending: Internal Medicine

## 2011-07-04 DIAGNOSIS — I872 Venous insufficiency (chronic) (peripheral): Secondary | ICD-10-CM | POA: Insufficient documentation

## 2011-07-04 DIAGNOSIS — I10 Essential (primary) hypertension: Secondary | ICD-10-CM | POA: Insufficient documentation

## 2011-07-04 DIAGNOSIS — Z79899 Other long term (current) drug therapy: Secondary | ICD-10-CM | POA: Insufficient documentation

## 2011-07-04 DIAGNOSIS — L97809 Non-pressure chronic ulcer of other part of unspecified lower leg with unspecified severity: Secondary | ICD-10-CM | POA: Insufficient documentation

## 2011-07-04 DIAGNOSIS — Z8614 Personal history of Methicillin resistant Staphylococcus aureus infection: Secondary | ICD-10-CM | POA: Insufficient documentation

## 2011-08-01 ENCOUNTER — Encounter (HOSPITAL_BASED_OUTPATIENT_CLINIC_OR_DEPARTMENT_OTHER): Payer: Medicare Other | Attending: Internal Medicine

## 2011-08-01 DIAGNOSIS — I10 Essential (primary) hypertension: Secondary | ICD-10-CM | POA: Insufficient documentation

## 2011-08-01 DIAGNOSIS — Z8614 Personal history of Methicillin resistant Staphylococcus aureus infection: Secondary | ICD-10-CM | POA: Insufficient documentation

## 2011-08-01 DIAGNOSIS — L97809 Non-pressure chronic ulcer of other part of unspecified lower leg with unspecified severity: Secondary | ICD-10-CM | POA: Insufficient documentation

## 2011-08-01 DIAGNOSIS — Z79899 Other long term (current) drug therapy: Secondary | ICD-10-CM | POA: Insufficient documentation

## 2011-08-01 DIAGNOSIS — I872 Venous insufficiency (chronic) (peripheral): Secondary | ICD-10-CM | POA: Insufficient documentation

## 2011-09-12 ENCOUNTER — Encounter (HOSPITAL_BASED_OUTPATIENT_CLINIC_OR_DEPARTMENT_OTHER): Payer: Medicare Other | Attending: Internal Medicine

## 2011-09-12 DIAGNOSIS — Z79899 Other long term (current) drug therapy: Secondary | ICD-10-CM | POA: Insufficient documentation

## 2011-09-12 DIAGNOSIS — L97809 Non-pressure chronic ulcer of other part of unspecified lower leg with unspecified severity: Secondary | ICD-10-CM | POA: Insufficient documentation

## 2011-09-12 DIAGNOSIS — I872 Venous insufficiency (chronic) (peripheral): Secondary | ICD-10-CM | POA: Insufficient documentation

## 2011-10-10 ENCOUNTER — Encounter (HOSPITAL_BASED_OUTPATIENT_CLINIC_OR_DEPARTMENT_OTHER): Payer: Medicare Other

## 2011-10-28 IMAGING — CR DG CHEST 1V PORT
1 series · 1 of 1 positions shown · non-contrast
Comparison: 12/22/2009 and earlier.

CLINICAL DATA: 87-year-old female with shortness of breath,
possible pneumonia.

PORTABLE CHEST - 1 VIEW

[AP]
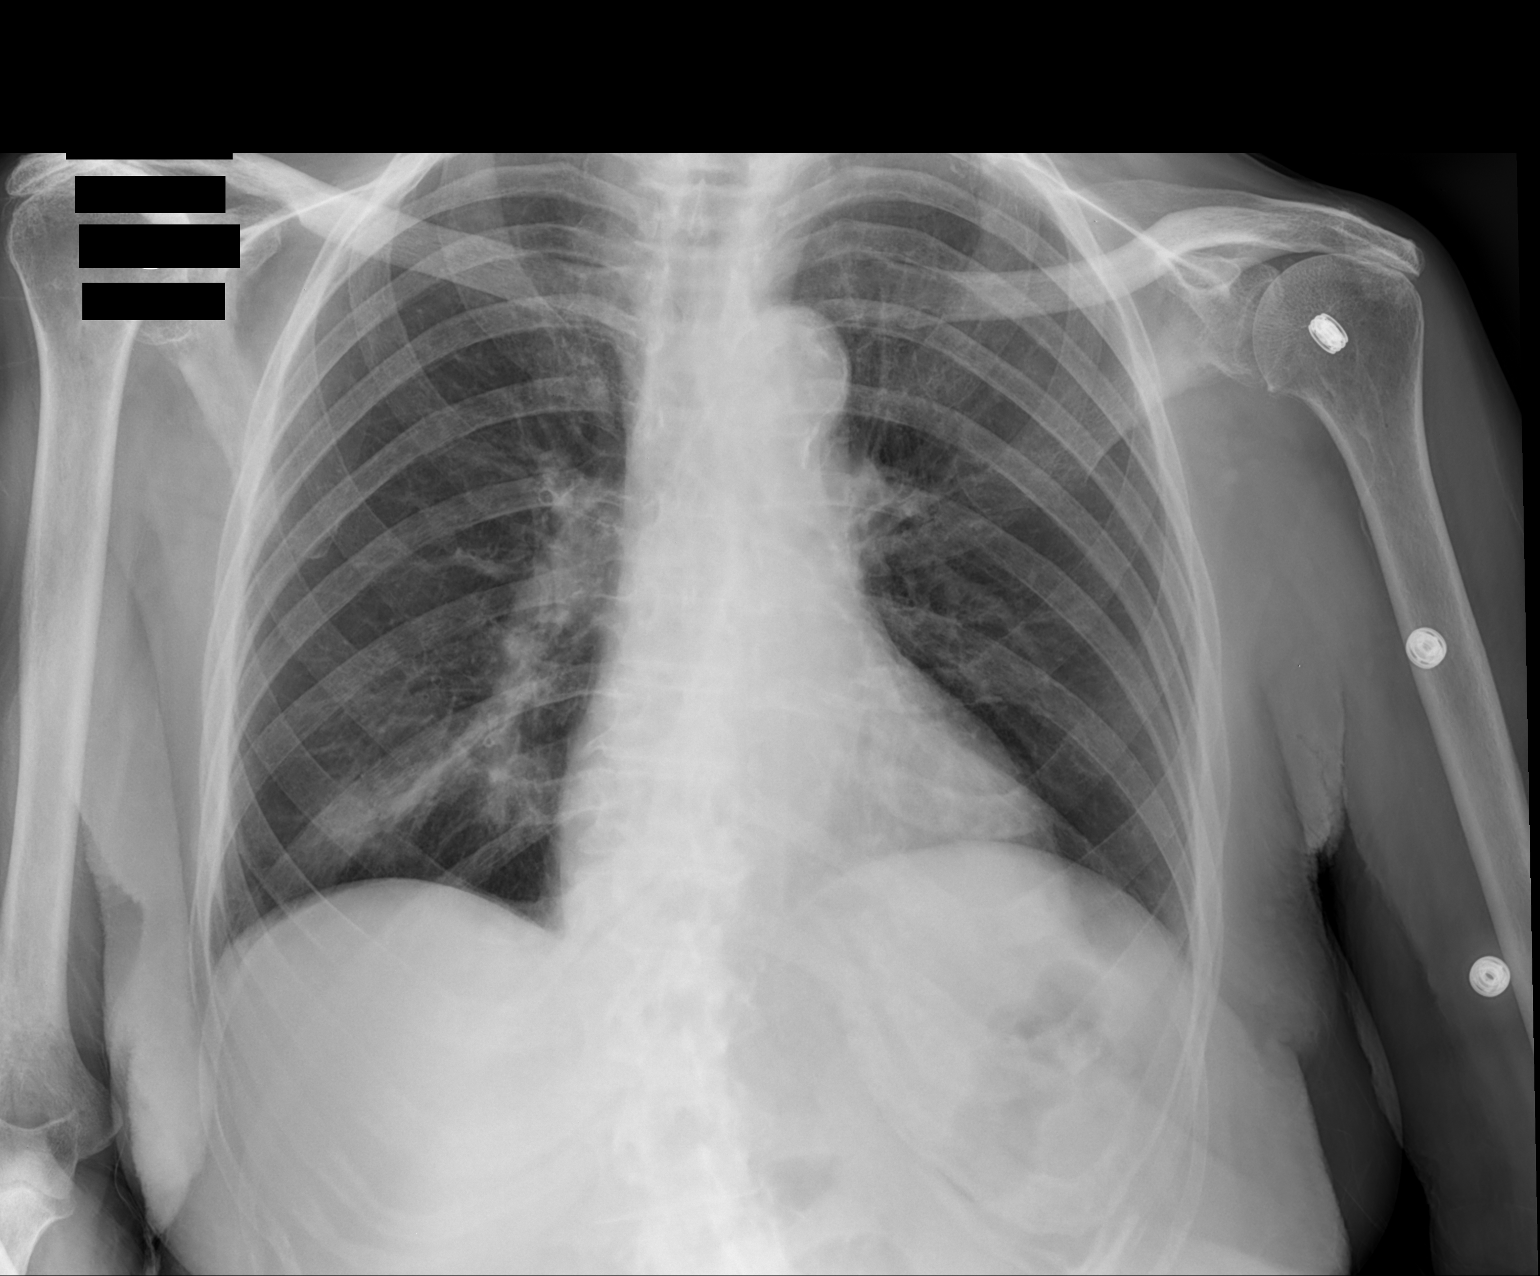

[1 of 1 positions shown; findings below may reference images not displayed]

FINDINGS: Portable upright AP view 8322 hours.  There is increased
peribronchovascular opacity in the right lower lobe.  No
pneumothorax, pulmonary edema, pleural effusion or consolidation.
Cardiac size and mediastinal contours are within normal limits.
IMPRESSION: New right lung base peribronchovascular opacity suspicious for
acute bronchopneumonia.

## 2011-10-29 IMAGING — CR DG CHEST 1V PORT
1 series · 1 of 1 positions shown · non-contrast
Comparison: 03/26/2010

CLINICAL DATA: PICC line placement. Right lower lobe infiltrate.

PORTABLE CHEST - 1 VIEW

[AP]
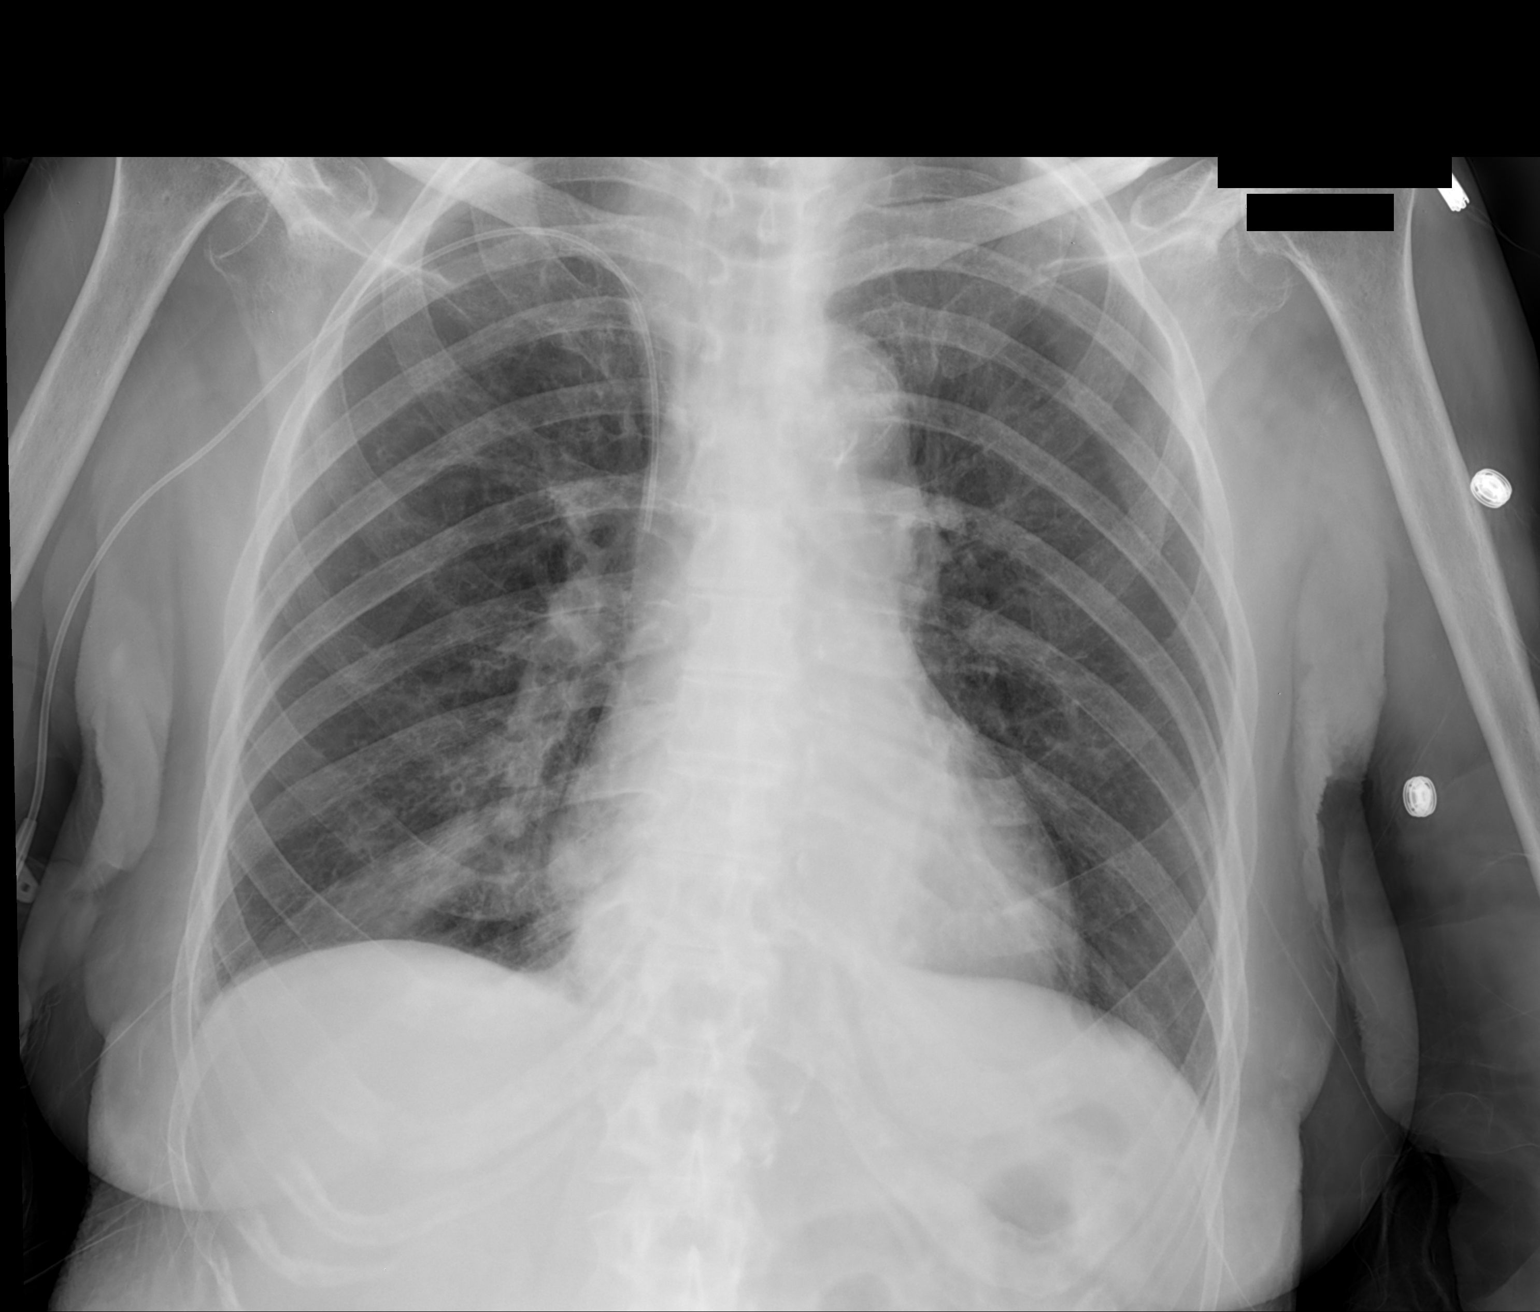

[1 of 1 positions shown; findings below may reference images not displayed]

FINDINGS: Right-sided PICC line has been inserted and the tip is in
good position in the superior vena cava just below the level of the
carina.  No pneumothorax.  There is a vague area of density at the
right lung base which may represent a patchy pneumonia.  This
appears slightly more prominent than on the prior exam.

The left lung is clear.  Heart size and vascularity are normal.
IMPRESSION: 1.  PICC line is in good position.
2.  Slight progression of patchy infiltrate at the right base.

## 2011-11-01 IMAGING — CR DG CHEST 2V
3 series · 3 of 3 positions shown · non-contrast
Comparison: 03/27/2010

CLINICAL DATA: Fever

CHEST - 2 VIEW

[w chest lat]
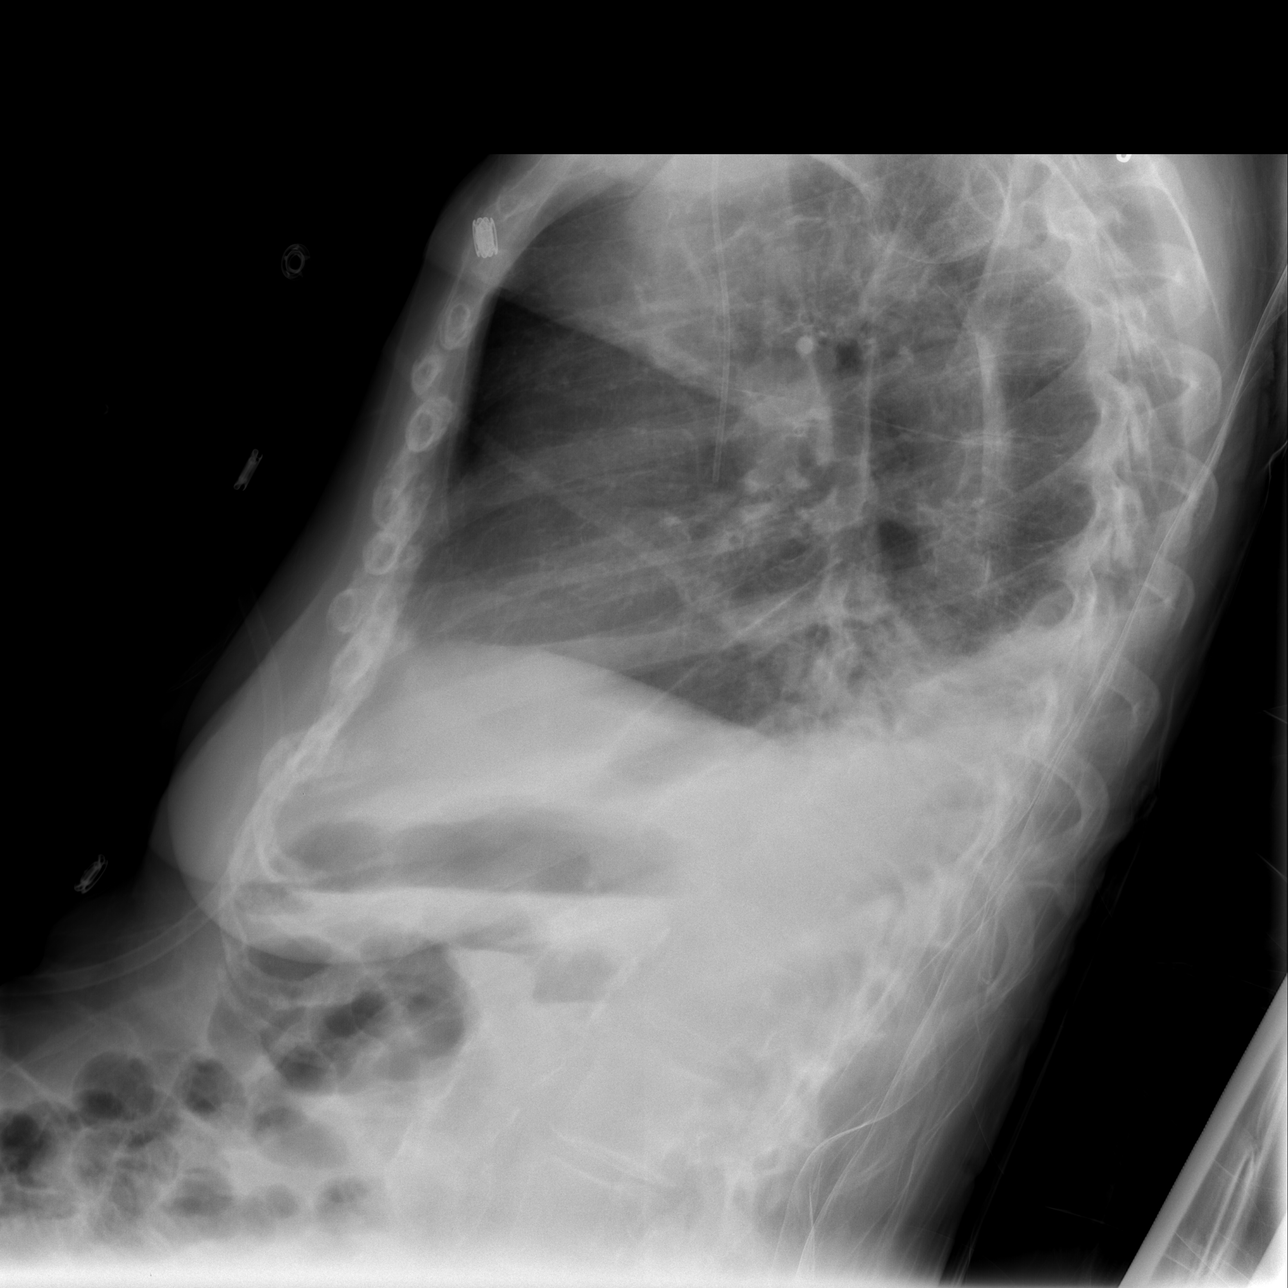

[view not recorded (1 of 2)]
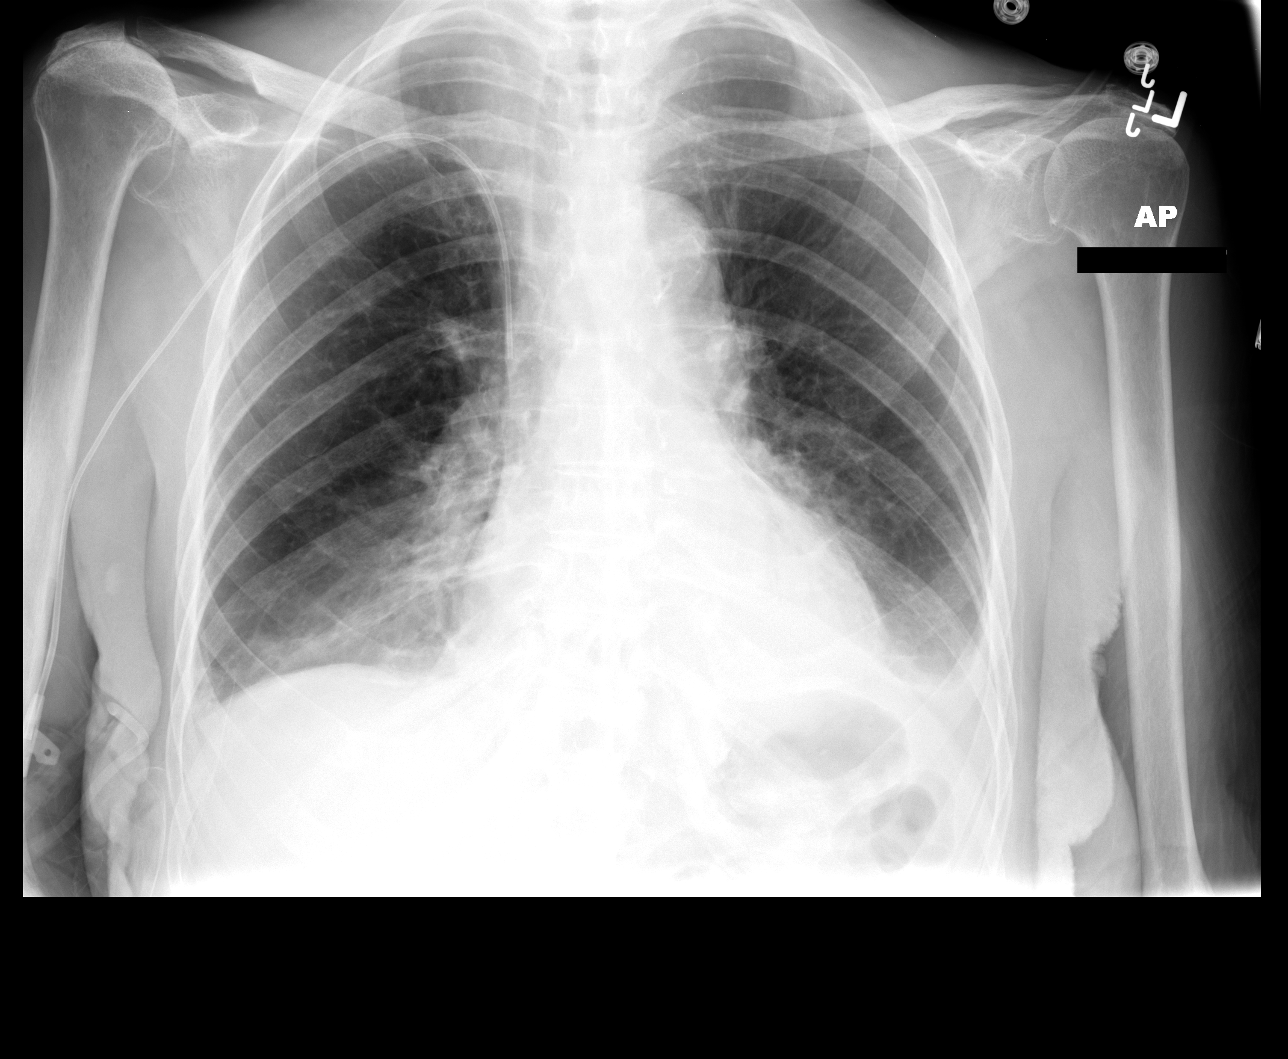

[view not recorded (2 of 2)]
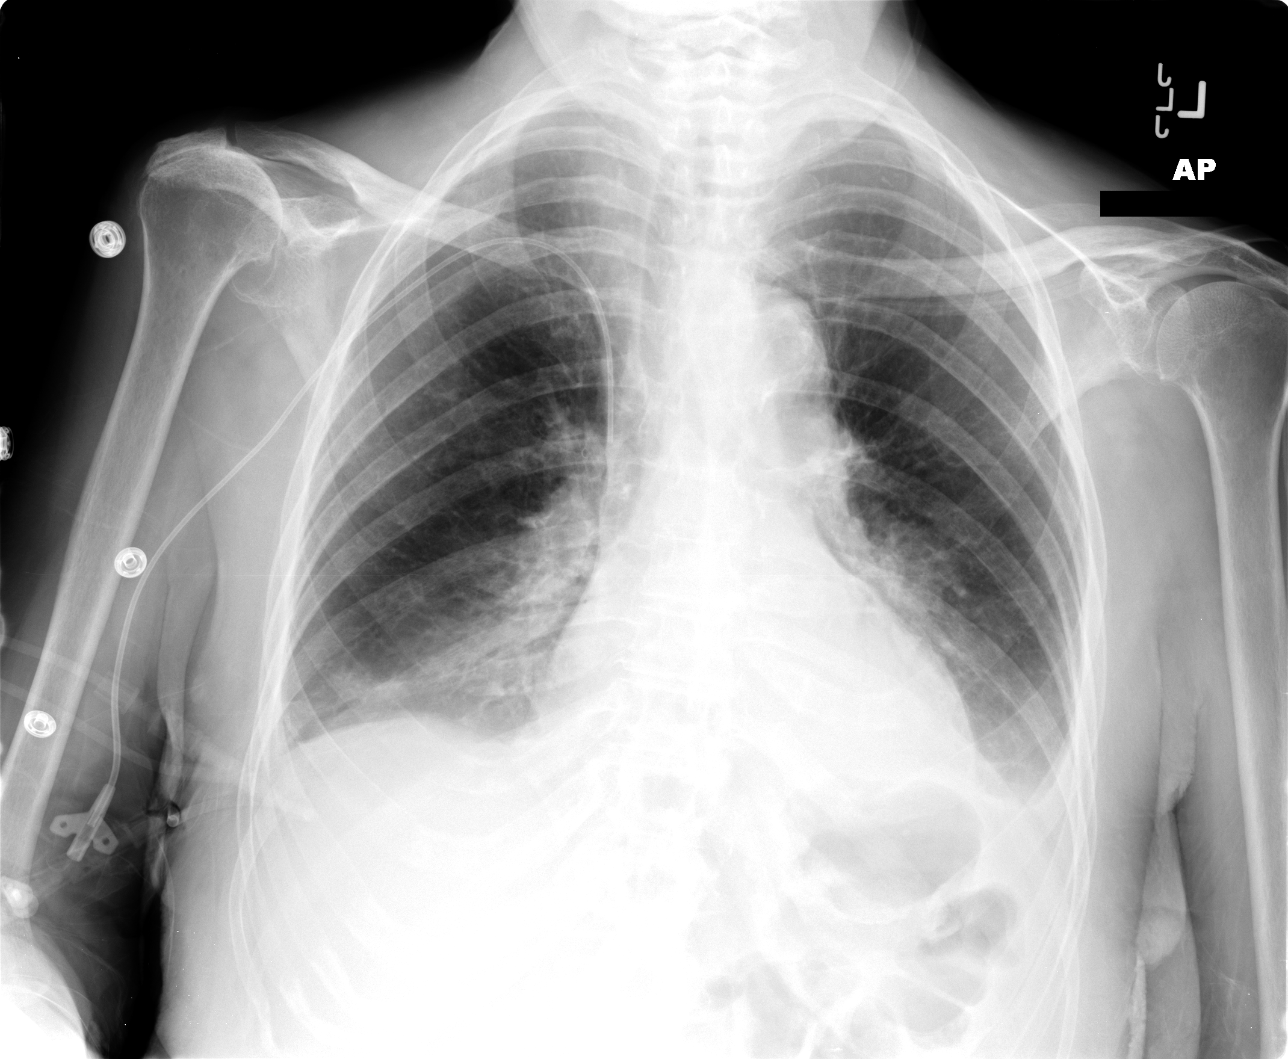

[3 of 3 positions shown; findings below may reference images not displayed]

FINDINGS: Cardiomediastinal silhouette is stable.  Stable right
PICC line position.  There is small bilateral pleural effusion with
bilateral basilar atelectasis or infiltrate.  No pulmonary edema.
Atherosclerotic calcifications of thoracic and abdominal aorta.
IMPRESSION: No pulmonary edema.  Small bilateral pleural effusion with
bilateral basilar atelectasis or infiltrate.

## 2013-10-20 ENCOUNTER — Encounter (HOSPITAL_BASED_OUTPATIENT_CLINIC_OR_DEPARTMENT_OTHER): Payer: Medicare Other | Attending: Internal Medicine

## 2013-10-20 DIAGNOSIS — L97819 Non-pressure chronic ulcer of other part of right lower leg with unspecified severity: Secondary | ICD-10-CM | POA: Insufficient documentation

## 2013-10-20 DIAGNOSIS — I87331 Chronic venous hypertension (idiopathic) with ulcer and inflammation of right lower extremity: Secondary | ICD-10-CM | POA: Insufficient documentation

## 2013-10-21 NOTE — Progress Notes (Signed)
[facility]  NAME:  [name], [name]                 ACCOUNT NO.:  [id number]  MEDICAL RECORD NO.:  [id number]      DATE OF BIRTH:  February 11, 1922  PHYSICIAN:  Ardath SaxPeter Azaylea Maves, M.D.           VISIT DATE:                                  OFFICE VISIT   This is an 80 year old lady from a nursing home who is really in poor physical condition.  She is bed ridden.  She has hypertension.  She has atrial fibrillation and she is on Plavix for this.  She is also on lisinopril and metoprolol.  She has atrial fibrillation, rheumatoid arthritis, and she has diagnosis of dysphagia and failure to thrive. Apparently, she struck her leg on a wheelchair, the right lateral lower leg and developed a hematoma which later ruptured and now she has a 14 x 7 ulcer on the lateral right leg.  The fat was exposed.  I would say this is from the contusion and a hematoma and then the area sloughed. We are going to treat this with collagen and wrapped with Kerlix and have her come back in a week.  We will have them change this dressing at the nursing home and put on collagen at the nursing home and wrap it. She will not have a problem with elevations and she is to be on bedrest all the time any way.  So, her diagnosis is some dementia, atrial fibrillation, hypertension, history of coronary disease, history of venous hypertension with inflammation and now she has a contusion to her right leg with a hematoma that cause a slough of her epithelium.     Ardath SaxPeter Jakeia Carreras, M.D.     PP/MEDQ  D:  10/20/2013  T:  10/21/2013  Job:  [id number]

## 2013-10-27 DIAGNOSIS — I87331 Chronic venous hypertension (idiopathic) with ulcer and inflammation of right lower extremity: Secondary | ICD-10-CM | POA: Diagnosis not present

## 2013-10-27 DIAGNOSIS — L97819 Non-pressure chronic ulcer of other part of right lower leg with unspecified severity: Secondary | ICD-10-CM | POA: Diagnosis not present

## 2013-11-03 DIAGNOSIS — I87331 Chronic venous hypertension (idiopathic) with ulcer and inflammation of right lower extremity: Secondary | ICD-10-CM | POA: Diagnosis not present

## 2013-11-03 DIAGNOSIS — L97819 Non-pressure chronic ulcer of other part of right lower leg with unspecified severity: Secondary | ICD-10-CM | POA: Diagnosis not present

## 2013-11-10 DIAGNOSIS — L97819 Non-pressure chronic ulcer of other part of right lower leg with unspecified severity: Secondary | ICD-10-CM | POA: Diagnosis not present

## 2013-11-10 DIAGNOSIS — I87331 Chronic venous hypertension (idiopathic) with ulcer and inflammation of right lower extremity: Secondary | ICD-10-CM | POA: Diagnosis not present

## 2013-11-15 ENCOUNTER — Encounter (HOSPITAL_BASED_OUTPATIENT_CLINIC_OR_DEPARTMENT_OTHER): Payer: Medicare Other | Attending: Plastic Surgery

## 2013-11-15 DIAGNOSIS — I878 Other specified disorders of veins: Secondary | ICD-10-CM | POA: Diagnosis present

## 2013-11-15 DIAGNOSIS — R627 Adult failure to thrive: Secondary | ICD-10-CM | POA: Diagnosis not present

## 2013-11-15 DIAGNOSIS — L928 Other granulomatous disorders of the skin and subcutaneous tissue: Secondary | ICD-10-CM | POA: Diagnosis not present

## 2013-11-15 DIAGNOSIS — L97819 Non-pressure chronic ulcer of other part of right lower leg with unspecified severity: Secondary | ICD-10-CM | POA: Insufficient documentation

## 2013-11-15 DIAGNOSIS — R1110 Vomiting, unspecified: Secondary | ICD-10-CM | POA: Diagnosis not present

## 2013-11-29 DIAGNOSIS — R627 Adult failure to thrive: Secondary | ICD-10-CM | POA: Diagnosis not present

## 2013-11-29 DIAGNOSIS — L928 Other granulomatous disorders of the skin and subcutaneous tissue: Secondary | ICD-10-CM | POA: Diagnosis not present

## 2013-11-29 DIAGNOSIS — L97819 Non-pressure chronic ulcer of other part of right lower leg with unspecified severity: Secondary | ICD-10-CM | POA: Diagnosis not present

## 2013-11-29 DIAGNOSIS — I878 Other specified disorders of veins: Secondary | ICD-10-CM | POA: Diagnosis not present

## 2013-12-13 DIAGNOSIS — R627 Adult failure to thrive: Secondary | ICD-10-CM | POA: Diagnosis not present

## 2013-12-13 DIAGNOSIS — L97819 Non-pressure chronic ulcer of other part of right lower leg with unspecified severity: Secondary | ICD-10-CM | POA: Diagnosis not present

## 2013-12-13 DIAGNOSIS — I878 Other specified disorders of veins: Secondary | ICD-10-CM | POA: Diagnosis not present

## 2013-12-13 DIAGNOSIS — L928 Other granulomatous disorders of the skin and subcutaneous tissue: Secondary | ICD-10-CM | POA: Diagnosis not present

## 2013-12-27 ENCOUNTER — Encounter (HOSPITAL_BASED_OUTPATIENT_CLINIC_OR_DEPARTMENT_OTHER): Payer: Medicare Other | Attending: Plastic Surgery

## 2014-01-14 DEATH — deceased

## 2014-03-11 ENCOUNTER — Encounter: Payer: Self-pay | Admitting: *Deleted
